# Patient Record
Sex: Male | Born: 1970 | Race: White | Hispanic: No | Marital: Married | Smoking: Former smoker
Health system: Southern US, Community
[De-identification: ages and names within clinical notes are randomized; demographics above are authoritative.]

## PROBLEM LIST (undated history)

## (undated) DIAGNOSIS — E079 Disorder of thyroid, unspecified: Secondary | ICD-10-CM

## (undated) DIAGNOSIS — E785 Hyperlipidemia, unspecified: Secondary | ICD-10-CM

## (undated) DIAGNOSIS — L570 Actinic keratosis: Secondary | ICD-10-CM

## (undated) HISTORY — DX: Actinic keratosis: L57.0

---
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
## 2005-11-07 ENCOUNTER — Ambulatory Visit: Payer: Self-pay | Admitting: Family Medicine

## 2005-11-14 ENCOUNTER — Ambulatory Visit: Payer: Self-pay | Admitting: Family Medicine

## 2006-01-02 ENCOUNTER — Ambulatory Visit: Payer: Self-pay

## 2011-05-05 DIAGNOSIS — E079 Disorder of thyroid, unspecified: Secondary | ICD-10-CM

## 2011-05-05 HISTORY — DX: Disorder of thyroid, unspecified: E07.9

## 2011-05-09 DIAGNOSIS — E785 Hyperlipidemia, unspecified: Secondary | ICD-10-CM

## 2011-05-09 HISTORY — DX: Hyperlipidemia, unspecified: E78.5

## 2016-03-25 ENCOUNTER — Ambulatory Visit
Admission: EM | Admit: 2016-03-25 | Discharge: 2016-03-25 | Disposition: A | Discharge status: Home / Self Care | Payer: BLUE CROSS/BLUE SHIELD | Attending: Family Medicine | Admitting: Family Medicine

## 2016-03-25 ENCOUNTER — Encounter: Payer: Self-pay | Admitting: *Deleted

## 2016-03-25 DIAGNOSIS — J0100 Acute maxillary sinusitis, unspecified: Secondary | ICD-10-CM

## 2016-03-25 DIAGNOSIS — J301 Allergic rhinitis due to pollen: Secondary | ICD-10-CM

## 2016-03-25 DIAGNOSIS — H6593 Unspecified nonsuppurative otitis media, bilateral: Secondary | ICD-10-CM

## 2016-03-25 HISTORY — DX: Hyperlipidemia, unspecified: E78.5

## 2016-03-25 HISTORY — DX: Disorder of thyroid, unspecified: E07.9

## 2016-03-25 MED ORDER — AMOXICILLIN-POT CLAVULANATE 875-125 MG PO TABS: 1.0000 | ORAL_TABLET | Freq: Two times a day (BID) | ORAL | Status: DC

## 2016-03-25 MED ORDER — FLUTICASONE PROPIONATE 50 MCG/ACT NA SUSP: 1.0000 | Freq: Two times a day (BID) | NASAL | Status: DC

## 2016-03-25 MED ORDER — SALINE SPRAY 0.65 % NA SOLN: 2.0000 | NASAL | Status: DC

## 2016-03-25 MED ORDER — PSEUDOEPHEDRINE HCL 30 MG PO TABS: 30.0000 mg | ORAL_TABLET | ORAL | Status: AC | PRN

## 2016-03-25 NOTE — ED Provider Notes (Signed)
CSN: [id number]     Arrival date & time 03/25/16  0815 History   First MD Initiated Contact with Patient 03/25/16 0840     Chief Complaint  Patient presents with  . Sore Throat   (Consider location/radiation/quality/duration/timing/severity/associated sxs/prior Treatment) HPI Comments: Married caucasian male here for evaluation of sore throat, sinus pressure, ear pressure left greater than right.  Has tried zyrtec, tylenol, sudafed, saline and flonase.  Stopped saline and flonase earlier this week.  Denied sinus infection in the past year but has had them during his lifetime.  PMHx seasonal allergies zyrtec typically works  Patient is a 45 y.o. male presenting with pharyngitis. The history is provided by the patient.  Sore Throat This is a new problem. The current episode started more than 1 week ago. The problem occurs constantly. The problem has been gradually worsening. Pertinent negatives include no chest pain, no abdominal pain, no headaches and no shortness of breath. The symptoms are aggravated by swallowing. The symptoms are relieved by NSAIDs. He has tried acetaminophen, rest, food and water for the symptoms. The treatment provided mild relief.    Past Medical History  Diagnosis Date  . Thyroid disease   . Hyperlipemia    Past Surgical History  Procedure Laterality Date  . Appendectomy     History reviewed. No pertinent family history. Social History  Substance Use Topics  . Smoking status: Former Research scientist (life sciences)  . Smokeless tobacco: None  . Alcohol Use: No    Review of Systems  Constitutional: Negative for fever, chills, diaphoresis, activity change, appetite change, fatigue and unexpected weight change.  HENT: Positive for congestion, ear pain, postnasal drip, sinus pressure and sore throat. Negative for dental problem, drooling, ear discharge, facial swelling, hearing loss, mouth sores, nosebleeds, rhinorrhea, sneezing, tinnitus, trouble swallowing and voice change.   Eyes:  Negative for photophobia, pain, discharge, redness, itching and visual disturbance.  Respiratory: Positive for cough. Negative for choking, chest tightness, shortness of breath, wheezing and stridor.   Cardiovascular: Negative for chest pain, palpitations and leg swelling.  Gastrointestinal: Negative for nausea, vomiting, abdominal pain, diarrhea, constipation, blood in stool and abdominal distention.  Endocrine: Negative for cold intolerance and heat intolerance.  Genitourinary: Negative for dysuria.  Musculoskeletal: Negative for myalgias, back pain, joint swelling, arthralgias, gait problem, neck pain and neck stiffness.  Skin: Negative for color change, pallor, rash and wound.  Allergic/Immunologic: Positive for environmental allergies. Negative for food allergies and immunocompromised state.  Neurological: Negative for dizziness, tremors, seizures, syncope, facial asymmetry, speech difficulty, weakness, light-headedness, numbness and headaches.  Hematological: Negative for adenopathy. Does not bruise/bleed easily.  Psychiatric/Behavioral: Negative for behavioral problems, confusion, sleep disturbance and agitation.    Allergies  Review of patient's allergies indicates no known allergies.  Home Medications   Prior to Admission medications   Medication Sig Start Date End Date Taking? Authorizing Provider  cetirizine (ZYRTEC) 10 MG tablet Take 10 mg by mouth daily.   Yes Historical Provider, MD  levothyroxine (SYNTHROID, LEVOTHROID) 150 MCG tablet Take 150 mcg by mouth daily before breakfast.   Yes Historical Provider, MD  Rosuvastatin Calcium (CRESTOR PO) Take by mouth.   Yes Historical Provider, MD  amoxicillin-clavulanate (AUGMENTIN) 875-125 MG tablet Take 1 tablet by mouth every 12 (twelve) hours. 03/25/16   Olen Cordial, NP  fluticasone (FLONASE) 50 MCG/ACT nasal spray Place 1 spray into both nostrils 2 (two) times daily. 03/25/16   Olen Cordial, NP  pseudoephedrine (SUDAFED)  30 MG tablet Take 1 tablet (  30 mg total) by mouth every 4 (four) hours as needed for congestion (max 240mg  per 24 hours/8 tabs). 03/25/16 04/01/16  Olen Cordial, NP  sodium chloride (OCEAN) 0.65 % SOLN nasal spray Place 2 sprays into both nostrils every 2 (two) hours while awake. 03/25/16 04/29/16  Olen Cordial, NP   Meds Ordered and Administered this Visit  Medications - No data to display  BP 115/70 mmHg  Pulse 63  Temp(Src) 97.7 F (36.5 C) (Oral)  Ht 5\' 10"  (1.778 m)  Wt 193 lb (87.544 kg)  BMI 27.69 kg/m2  SpO2 100% No data found.   Physical Exam  Constitutional: He is oriented to person, place, and time. Vital signs are normal. He appears well-developed and well-nourished. He is active and cooperative.  Non-toxic appearance. He does not have a sickly appearance. He appears ill. No distress.  HENT:  Head: Normocephalic and atraumatic.  Right Ear: Hearing, external ear and ear canal normal. A middle ear effusion is present.  Left Ear: Hearing, external ear and ear canal normal. A middle ear effusion is present.  Nose: Mucosal edema and rhinorrhea present. No nose lacerations, sinus tenderness, nasal deformity, septal deviation or nasal septal hematoma. No epistaxis.  No foreign bodies. Right sinus exhibits no maxillary sinus tenderness and no frontal sinus tenderness. Left sinus exhibits maxillary sinus tenderness. Left sinus exhibits no frontal sinus tenderness.  Mouth/Throat: Uvula is midline and mucous membranes are normal. Mucous membranes are not pale, not dry and not cyanotic. He does not have dentures. No oral lesions. No trismus in the jaw. Normal dentition. No dental abscesses, uvula swelling, lacerations or dental caries. Posterior oropharyngeal edema and posterior oropharyngeal erythema present. No oropharyngeal exudate or tonsillar abscesses.  Cobblestoning posterior pharynx; bilateral TMs with air fluid level; bilateral nasal turbinates edema/erythema cloudy discharge  and congestion; bilateral allergic shiners  Eyes: Conjunctivae, EOM and lids are normal. Pupils are equal, round, and reactive to light. Right eye exhibits no chemosis, no discharge, no exudate and no hordeolum. No foreign body present in the right eye. Left eye exhibits no chemosis, no discharge, no exudate and no hordeolum. No foreign body present in the left eye. Right conjunctiva is not injected. Right conjunctiva has no hemorrhage. Left conjunctiva is not injected. Left conjunctiva has no hemorrhage. No scleral icterus. Right eye exhibits normal extraocular motion and no nystagmus. Left eye exhibits normal extraocular motion and no nystagmus. Right pupil is round and reactive. Left pupil is round and reactive. Pupils are equal.  Neck: Trachea normal and normal range of motion. Neck supple. No tracheal tenderness, no spinous process tenderness and no muscular tenderness present. No rigidity. No tracheal deviation, no edema, no erythema and normal range of motion present. No thyroid mass and no thyromegaly present.  Cardiovascular: Normal rate, regular rhythm, S1 normal, S2 normal, normal heart sounds and intact distal pulses.  PMI is not displaced.  Exam reveals no gallop and no friction rub.   No murmur heard. Pulmonary/Chest: Effort normal and breath sounds normal. No stridor. No respiratory distress. He has no decreased breath sounds. He has no wheezes. He has no rhonchi. He has no rales.  Abdominal: Soft. He exhibits no distension.  Musculoskeletal: Normal range of motion. He exhibits no edema or tenderness.  Lymphadenopathy:       Head (right side): No submental, no submandibular, no tonsillar, no preauricular, no posterior auricular and no occipital adenopathy present.       Head (left side): No submental, no submandibular,  no tonsillar, no preauricular, no posterior auricular and no occipital adenopathy present.    He has no cervical adenopathy.       Right cervical: No superficial cervical,  no deep cervical and no posterior cervical adenopathy present.      Left cervical: No superficial cervical, no deep cervical and no posterior cervical adenopathy present.  Neurological: He is alert and oriented to person, place, and time. He displays no atrophy and no tremor. No cranial nerve deficit or sensory deficit. He exhibits normal muscle tone. He displays no seizure activity. Coordination and gait normal. GCS eye subscore is 4. GCS verbal subscore is 5. GCS motor subscore is 6.  Skin: Skin is warm, dry and intact. No abrasion, no bruising, no burn, no ecchymosis, no laceration, no lesion, no petechiae and no rash noted. He is not diaphoretic. No cyanosis or erythema. No pallor. Nails show no clubbing.  Psychiatric: He has a normal mood and affect. His speech is normal and behavior is normal. Judgment and thought content normal. Cognition and memory are normal.  Nursing note and vitals reviewed.   ED Course  Procedures (including critical care time)  Labs Review Labs Reviewed - No data to display  Imaging Review No results found.    MDM   1. Acute maxillary sinusitis, recurrence not specified   2. Otitis media with effusion, bilateral   3. Seasonal allergic rhinitis due to pollen    Supportive treatment.   No evidence of invasive bacterial infection, non toxic and well hydrated.  This is most likely self limiting viral infection.  I do not see where any further testing or imaging is necessary at this time.   I will suggest supportive care, rest, good hygiene and encourage the patient to take adequate fluids.  The patient is to return to clinic or EMERGENCY ROOM if symptoms worsen or change significantly e.g. ear pain, fever, purulent discharge from ears or bleeding.  Exitcare handout on otitis media with effusion given to patient.  Patient verbalized agreement and understanding of treatment plan.    Continue zyrtec 10mg  po daily.  Be more aggressive with nasal saline use.  Restart  flonase 1 spray each nostril BID.  If still having post nasal drip start sudafed 30mg  1-2 tabs po q6h prn rhinitis.  Max 240mg  per 24 hours/8 tabs.  Discussed may cause drowsiness, increased heart rate and/or insomnia with patient.  If palpitations stop use.Patient may use normal saline nasal spray as needed.  Consider antihistamine or nasal steroid use.  Avoid triggers if possible.  Shower prior to bedtime if exposed to triggers.  If allergic dust/dust mites recommend mattress/pillow covers/encasements; washing linens, vacuuming, sweeping, dusting weekly.  Call or return to clinic as needed if these symptoms worsen or fail to improve as anticipated.   Exitcare handout on allergic rhinitis given to patient.  Patient verbalized understanding of instructions, agreed with plan of care and had no further questions at this time.  P2:  Avoidance and hand washing.  Restart flonase 1 spray each nostril BID, saline 2 sprays each nostril q2h prn congestion.  If no improvement with 48 hours of saline and flonase use start augmentin 875mg  po BID x 10 days.  Rx given.  No evidence of systemic bacterial infection, non toxic and well hydrated.  I do not see where any further testing or imaging is necessary at this time.   I will suggest supportive care, rest, good hygiene and encourage the patient to take adequate fluids.  The patient is to return to clinic or EMERGENCY ROOM if symptoms worsen or change significantly.  Exitcare handout on sinusitis given to patient.  Patient verbalized agreement and understanding of treatment plan and had no further questions at this time.   P2:  Hand washing and cover cough  Tylenol 1000mg  po QID prn pain.  Suspect due to post nasal drip. Usually no specific medical treatment is needed if a virus is causing the sore throat.  The throat most often gets better on its own within 5 to 7 days.  Antibiotic medicine does not cure viral pharyngitis.   For acute pharyngitis caused by bacteria, your  healthcare provider will prescribe an antibiotic.  Marland Kitchen Do not smoke.  Marland Kitchen Avoid secondhand smoke and other air pollutants.  . Use a cool mist humidifier to add moisture to the air.  . Get plenty of rest.  . You may want to rest your throat by talking less and eating a diet that is mostly liquid or soft for a day or two.   Marland Kitchen Nonprescription throat lozenges and mouthwashes should help relieve the soreness.   . Gargling with warm saltwater and drinking warm liquids may help.  (You can make a saltwater solution by adding 1/4 teaspoon of salt to 8 ounces, or 240 mL, of warm water.)  . A nonprescription pain reliever such as aspirin, acetaminophen, or ibuprofen may ease general aches and pains.   FOLLOW UP with clinic provider if no improvements in the next 7-10 days.  Patient verbalized understanding of instructions and agreed with plan of care. P2:  Hand washing and diet.    Olen Cordial, NP 03/25/16 [phone]

## 2016-03-25 NOTE — ED Notes (Signed)
Pt states that he started with facial pain, eye drainage, green mucus, ear pressure, jaw pain, sore throat started last week

## 2016-03-25 NOTE — Discharge Instructions (Signed)
Allergic Rhinitis Allergic rhinitis is when the mucous membranes in the nose respond to allergens. Allergens are particles in the air that cause your body to have an allergic reaction. This causes you to release allergic antibodies. Through a chain of events, these eventually cause you to release histamine into the blood stream. Although meant to protect the body, it is this release of histamine that causes your discomfort, such as frequent sneezing, congestion, and an itchy, runny nose.  CAUSES Seasonal allergic rhinitis (hay fever) is caused by pollen allergens that may come from grasses, trees, and weeds. Year-round allergic rhinitis (perennial allergic rhinitis) is caused by allergens such as house dust mites, pet dander, and mold spores. SYMPTOMS  Nasal stuffiness (congestion).  Itchy, runny nose with sneezing and tearing of the eyes. DIAGNOSIS Your health care provider can help you determine the allergen or allergens that trigger your symptoms. If you and your health care provider are unable to determine the allergen, skin or blood testing may be used. Your health care provider will diagnose your condition after taking your health history and performing a physical exam. Your health care provider may assess you for other related conditions, such as asthma, pink eye, or an ear infection. TREATMENT Allergic rhinitis does not have a cure, but it can be controlled by:  Medicines that block allergy symptoms. These may include allergy shots, nasal sprays, and oral antihistamines.  Avoiding the allergen. Hay fever may often be treated with antihistamines in pill or nasal spray forms. Antihistamines block the effects of histamine. There are over-the-counter medicines that may help with nasal congestion and swelling around the eyes. Check with your health care provider before taking or giving this medicine. If avoiding the allergen or the medicine prescribed do not work, there are many new medicines  your health care provider can prescribe. Stronger medicine may be used if initial measures are ineffective. Desensitizing injections can be used if medicine and avoidance does not work. Desensitization is when a patient is given ongoing shots until the body becomes less sensitive to the allergen. Make sure you follow up with your health care provider if problems continue. HOME CARE INSTRUCTIONS It is not possible to completely avoid allergens, but you can reduce your symptoms by taking steps to limit your exposure to them. It helps to know exactly what you are allergic to so that you can avoid your specific triggers. SEEK MEDICAL CARE IF:  You have a fever.  You develop a cough that does not stop easily (persistent).  You have shortness of breath.  You start wheezing.  Symptoms interfere with normal daily activities.   This information is not intended to replace advice given to you by your health care provider. Make sure you discuss any questions you have with your health care provider.   Document Released: 08/15/2001 Document Revised: 12/11/2014 Document Reviewed: 07/28/2013 Elsevier Interactive Patient Education 2016 [place]. Otitis Media With Effusion Otitis media with effusion is the presence of fluid in the middle ear. This is a common problem in children, which often follows ear infections. It may be present for weeks or longer after the infection. Unlike an acute ear infection, otitis media with effusion refers only to fluid behind the ear drum and not infection. Children with repeated ear and sinus infections and allergy problems are the most likely to get otitis media with effusion. CAUSES  The most frequent cause of the fluid buildup is dysfunction of the eustachian tubes. These are the tubes that drain fluid  in the ears to the back of the nose (nasopharynx). SYMPTOMS   The main symptom of this condition is hearing loss. As a result, you or your child may:  Listen to the TV  at a loud volume.  Not respond to questions.  Ask "what" often when spoken to.  Mistake or confuse one sound or word for another.  There may be a sensation of fullness or pressure but usually not pain. DIAGNOSIS   Your health care provider will diagnose this condition by examining you or your child's ears.  Your health care provider may test the pressure in you or your child's ear with a tympanometer.  A hearing test may be conducted if the problem persists. TREATMENT   Treatment depends on the duration and the effects of the effusion.  Antibiotics, decongestants, nose drops, and cortisone-type drugs (tablets or nasal spray) may not be helpful.  Children with persistent ear effusions may have delayed language or behavioral problems. Children at risk for developmental delays in hearing, learning, and speech may require referral to a specialist earlier than children not at risk.  You or your child's health care provider may suggest a referral to an ear, nose, and throat surgeon for treatment. The following may help restore normal hearing:  Drainage of fluid.  Placement of ear tubes (tympanostomy tubes).  Removal of adenoids (adenoidectomy). HOME CARE INSTRUCTIONS   Avoid secondhand smoke.  Infants who are breastfed are less likely to have this condition.  Avoid feeding infants while they are lying flat.  Avoid known environmental allergens.  Avoid people who are sick. SEEK MEDICAL CARE IF:   Hearing is not better in 3 months.  Hearing is worse.  Ear pain.  Drainage from the ear.  Dizziness. MAKE SURE YOU:   Understand these instructions.  Will watch your condition.  Will get help right away if you are not doing well or get worse.   This information is not intended to replace advice given to you by your health care provider. Make sure you discuss any questions you have with your health care provider.   Document Released: 12/28/2004 Document Revised:  12/11/2014 Document Reviewed: 06/17/2013 Elsevier Interactive Patient Education 2016 [employer] Sinusitis, Adult Sinusitis is redness, soreness, and inflammation of the paranasal sinuses. Paranasal sinuses are air pockets within the bones of your face. They are located beneath your eyes, in the middle of your forehead, and above your eyes. In healthy paranasal sinuses, mucus is able to drain out, and air is able to circulate through them by way of your nose. However, when your paranasal sinuses are inflamed, mucus and air can become trapped. This can allow bacteria and other germs to grow and cause infection. Sinusitis can develop quickly and last only a short time (acute) or continue over a long period (chronic). Sinusitis that lasts for more than 12 weeks is considered chronic. CAUSES Causes of sinusitis include:  Allergies.  Structural abnormalities, such as displacement of the cartilage that separates your nostrils (deviated septum), which can decrease the air flow through your nose and sinuses and affect sinus drainage.  Functional abnormalities, such as when the small hairs (cilia) that line your sinuses and help remove mucus do not work properly or are not present. SIGNS AND SYMPTOMS Symptoms of acute and chronic sinusitis are the same. The primary symptoms are pain and pressure around the affected sinuses. Other symptoms include:  Upper toothache.  Earache.  Headache.  Bad breath.  Decreased sense of smell and taste.  A cough, which worsens when you are lying flat.  Fatigue.  Fever.  Thick drainage from your nose, which often is green and may contain pus (purulent).  Swelling and warmth over the affected sinuses. DIAGNOSIS Your health care provider will perform a physical exam. During your exam, your health care provider may perform any of the following to help determine if you have acute sinusitis or chronic sinusitis:  Look in your nose for signs of abnormal  growths in your nostrils (nasal polyps).  Tap over the affected sinus to check for signs of infection.  View the inside of your sinuses using an imaging device that has a light attached (endoscope). If your health care provider suspects that you have chronic sinusitis, one or more of the following tests may be recommended:  Allergy tests.  Nasal culture. A sample of mucus is taken from your nose, sent to a lab, and screened for bacteria.  Nasal cytology. A sample of mucus is taken from your nose and examined by your health care provider to determine if your sinusitis is related to an allergy. TREATMENT Most cases of acute sinusitis are related to a viral infection and will resolve on their own within 10 days. Sometimes, medicines are prescribed to help relieve symptoms of both acute and chronic sinusitis. These may include pain medicines, decongestants, nasal steroid sprays, or saline sprays. However, for sinusitis related to a bacterial infection, your health care provider will prescribe antibiotic medicines. These are medicines that will help kill the bacteria causing the infection. Rarely, sinusitis is caused by a fungal infection. In these cases, your health care provider will prescribe antifungal medicine. For some cases of chronic sinusitis, surgery is needed. Generally, these are cases in which sinusitis recurs more than 3 times per year, despite other treatments. HOME CARE INSTRUCTIONS  Drink plenty of water. Water helps thin the mucus so your sinuses can drain more easily.  Use a humidifier.  Inhale steam 3-4 times a day (for example, sit in the bathroom with the shower running).  Apply a warm, moist washcloth to your face 3-4 times a day, or as directed by your health care provider.  Use saline nasal sprays to help moisten and clean your sinuses.  Take medicines only as directed by your health care provider.  If you were prescribed either an antibiotic or antifungal medicine,  finish it all even if you start to feel better. SEEK IMMEDIATE MEDICAL CARE IF:  You have increasing pain or severe headaches.  You have nausea, vomiting, or drowsiness.  You have swelling around your face.  You have vision problems.  You have a stiff neck.  You have difficulty breathing.   This information is not intended to replace advice given to you by your health care provider. Make sure you discuss any questions you have with your health care provider.   Document Released: 11/20/2005 Document Revised: 12/11/2014 Document Reviewed: 12/05/2011 Elsevier Interactive Patient Education [employer].

## 2016-12-02 ENCOUNTER — Inpatient Hospital Stay
Admission: EM | Admit: 2016-12-02 | Discharge: 2016-12-07 | DRG: 337 | Disposition: A | Discharge status: Home / Self Care | Payer: BLUE CROSS/BLUE SHIELD | Attending: Surgery | Admitting: Surgery

## 2016-12-02 ENCOUNTER — Inpatient Hospital Stay: Payer: BLUE CROSS/BLUE SHIELD

## 2016-12-02 ENCOUNTER — Emergency Department: Payer: BLUE CROSS/BLUE SHIELD

## 2016-12-02 ENCOUNTER — Encounter: Payer: Self-pay | Admitting: *Deleted

## 2016-12-02 DIAGNOSIS — K5651 Intestinal adhesions [bands], with partial obstruction: Secondary | ICD-10-CM | POA: Diagnosis present

## 2016-12-02 DIAGNOSIS — K219 Gastro-esophageal reflux disease without esophagitis: Secondary | ICD-10-CM | POA: Diagnosis present

## 2016-12-02 DIAGNOSIS — E785 Hyperlipidemia, unspecified: Secondary | ICD-10-CM | POA: Diagnosis present

## 2016-12-02 DIAGNOSIS — R1084 Generalized abdominal pain: Secondary | ICD-10-CM

## 2016-12-02 DIAGNOSIS — Z79899 Other long term (current) drug therapy: Secondary | ICD-10-CM

## 2016-12-02 DIAGNOSIS — K56609 Unspecified intestinal obstruction, unspecified as to partial versus complete obstruction: Secondary | ICD-10-CM | POA: Diagnosis present

## 2016-12-02 DIAGNOSIS — E079 Disorder of thyroid, unspecified: Secondary | ICD-10-CM | POA: Diagnosis present

## 2016-12-02 DIAGNOSIS — Z87891 Personal history of nicotine dependence: Secondary | ICD-10-CM | POA: Diagnosis not present

## 2016-12-02 LAB — URINALYSIS, COMPLETE (UACMP) WITH MICROSCOPIC
BACTERIA UA: NONE SEEN
BILIRUBIN URINE: NEGATIVE
GLUCOSE, UA: NEGATIVE mg/dL
HGB URINE DIPSTICK: NEGATIVE
Ketones, ur: 5 mg/dL — AB
LEUKOCYTES UA: NEGATIVE
NITRITE: NEGATIVE
PROTEIN: 30 mg/dL — AB
Specific Gravity, Urine: >1.046 — ABNORMAL HIGH (ref 1.005–1.030)
pH: 5 (ref 5.0–8.0)

## 2016-12-02 LAB — COMPREHENSIVE METABOLIC PANEL
ALT: 31 U/L (ref 17–63)
ANION GAP: 7 (ref 5–15)
AST: 22 U/L (ref 15–41)
Albumin: 4.5 g/dL (ref 3.5–5.0)
Alkaline Phosphatase: 55 U/L (ref 38–126)
BUN: 21 mg/dL — ABNORMAL HIGH (ref 6–20)
CHLORIDE: 104 mmol/L (ref 101–111)
CO2: 27 mmol/L (ref 22–32)
Calcium: 9.7 mg/dL (ref 8.9–10.3)
Creatinine, Ser: 1.23 mg/dL (ref 0.61–1.24)
Glucose, Bld: 101 mg/dL — ABNORMAL HIGH (ref 65–99)
POTASSIUM: 3.5 mmol/L (ref 3.5–5.1)
Sodium: 138 mmol/L (ref 135–145)
Total Bilirubin: 1.1 mg/dL (ref 0.3–1.2)
Total Protein: 8.1 g/dL (ref 6.5–8.1)

## 2016-12-02 LAB — CBC
HEMATOCRIT: 49.1 % (ref 40.0–52.0)
Hemoglobin: 17.2 g/dL (ref 13.0–18.0)
MCH: 30.8 pg (ref 26.0–34.0)
MCHC: 35 g/dL (ref 32.0–36.0)
MCV: 87.9 fL (ref 80.0–100.0)
Platelets: 312 10*3/uL (ref 150–440)
RBC: 5.59 MIL/uL (ref 4.40–5.90)
RDW: 13.1 % (ref 11.5–14.5)
WBC: 10.1 10*3/uL (ref 3.8–10.6)

## 2016-12-02 LAB — LIPASE, BLOOD: LIPASE: 19 U/L (ref 11–51)

## 2016-12-02 LAB — TROPONIN I

## 2016-12-02 MED ORDER — MORPHINE SULFATE (PF) 4 MG/ML IV SOLN
4.0000 mg | INTRAVENOUS | Status: DC | PRN
  Administered 2016-12-02 (×2): 4 mg via INTRAVENOUS
  Filled 2016-12-02 (×3): qty 1

## 2016-12-02 MED ORDER — LACTATED RINGERS IV BOLUS (SEPSIS)
1000.0000 mL | Freq: Once | INTRAVENOUS | Status: AC
  Administered 2016-12-02: 1000 mL via INTRAVENOUS

## 2016-12-02 MED ORDER — PROMETHAZINE HCL 25 MG/ML IJ SOLN
INTRAMUSCULAR | Status: AC
  Filled 2016-12-02: qty 1

## 2016-12-02 MED ORDER — ONDANSETRON HCL 4 MG/2ML IJ SOLN
4.0000 mg | Freq: Four times a day (QID) | INTRAMUSCULAR | Status: DC | PRN
  Administered 2016-12-02 – 2016-12-07 (×2): 4 mg via INTRAVENOUS
  Filled 2016-12-02 (×2): qty 2

## 2016-12-02 MED ORDER — PROMETHAZINE HCL 25 MG/ML IJ SOLN
12.5000 mg | Freq: Once | INTRAMUSCULAR | Status: AC
  Administered 2016-12-02: 12.5 mg via INTRAVENOUS
  Filled 2016-12-02: qty 1

## 2016-12-02 MED ORDER — ONDANSETRON HCL 4 MG PO TABS: 4.0000 mg | ORAL_TABLET | Freq: Four times a day (QID) | ORAL | Status: DC | PRN

## 2016-12-02 MED ORDER — HEPARIN SODIUM (PORCINE) 5000 UNIT/ML IJ SOLN
5000.0000 [IU] | Freq: Three times a day (TID) | INTRAMUSCULAR | Status: DC
  Administered 2016-12-02 – 2016-12-07 (×13): 5000 [IU] via SUBCUTANEOUS
  Filled 2016-12-02 (×13): qty 1

## 2016-12-02 MED ORDER — PHENOL 1.4 % MT LIQD
1.0000 | OROMUCOSAL | Status: DC | PRN
  Administered 2016-12-03: 1 via OROMUCOSAL
  Filled 2016-12-02: qty 177

## 2016-12-02 MED ORDER — KCL IN DEXTROSE-NACL 20-5-0.45 MEQ/L-%-% IV SOLN
INTRAVENOUS | Status: DC
  Administered 2016-12-02 – 2016-12-07 (×14): via INTRAVENOUS
  Filled 2016-12-02 (×19): qty 1000

## 2016-12-02 MED ORDER — IOPAMIDOL (ISOVUE-300) INJECTION 61%
100.0000 mL | Freq: Once | INTRAVENOUS | Status: AC | PRN
  Administered 2016-12-02: 100 mL via INTRAVENOUS

## 2016-12-02 MED ORDER — FLUTICASONE PROPIONATE 50 MCG/ACT NA SUSP
1.0000 | Freq: Two times a day (BID) | NASAL | Status: DC | PRN
  Filled 2016-12-02: qty 16

## 2016-12-02 MED ORDER — SODIUM CHLORIDE 0.9 % IV BOLUS (SEPSIS)
1000.0000 mL | Freq: Once | INTRAVENOUS | Status: AC
  Administered 2016-12-02: 1000 mL via INTRAVENOUS

## 2016-12-02 NOTE — H&P (Signed)
Scott Gillespie. is an 45 y.o. male.    Chief Complaint: Nausea and vomiting  HPI: This patient with 3 days of nausea vomiting and diarrhea. He's had this happened multiple times over the past few years. He has had bloating and nausea but never emesis to the extent that he has in the last 3 days where he has been vomiting multiple times and cannot keep anything down not even liquids. He's had diarrhea and some abdominal pain that his abdominal pain is gone at this point. He denies fevers or chills.  Past Medical History:  Diagnosis Date  . Hyperlipemia   . Thyroid disease     Past Surgical History:  Procedure Laterality Date  . APPENDECTOMY      No family history on file. Social History:  reports that he has quit smoking. He does not have any smokeless tobacco history on file. He reports that he does not drink alcohol or use drugs.  Allergies: No Known Allergies   (Not in a hospital admission)   Review of Systems  Constitutional: Negative for chills, fever and weight loss.  HENT: Negative.   Eyes: Negative.   Respiratory: Negative.   Cardiovascular: Negative.   Gastrointestinal: Positive for abdominal pain, diarrhea, nausea and vomiting. Negative for blood in stool, constipation, heartburn and melena.  Genitourinary: Negative.   Musculoskeletal: Negative.   Skin: Negative.   Neurological: Negative.   Endo/Heme/Allergies: Negative.   Psychiatric/Behavioral: Negative.      Physical Exam:  BP (!) 135/55 (BP Location: Right Arm)   Pulse 99   Temp 97.8 F (36.6 C) (Oral)   Resp 18   Ht 5' 11"  (1.803 m)   Wt 200 lb (90.7 kg)   SpO2 95%   BMI 27.89 kg/m   Physical Exam  Constitutional: He is oriented to person, place, and time and well-developed, well-nourished, and in no distress. No distress.  Uncomfortable appearing anxious-appearing  HENT:  Head: Normocephalic.  Dry mucous membranes  Eyes: Pupils are equal, round, and reactive to light. Right eye  exhibits no discharge. Left eye exhibits no discharge. No scleral icterus.  Neck: Normal range of motion.  Cardiovascular: Normal rate, regular rhythm and normal heart sounds.   Pulmonary/Chest: Effort normal and breath sounds normal. No respiratory distress. He has no wheezes. He has no rales.  Abdominal: Soft. He exhibits distension. There is no tenderness. There is no rebound and no guarding.  Intended complex right lower quadrant scar abdomen is distended and tympanitic but nontender  Musculoskeletal: Normal range of motion. He exhibits no edema or tenderness.  Lymphadenopathy:    He has no cervical adenopathy.  Neurological: He is alert and oriented to person, place, and time.  Skin: Skin is warm and dry. No rash noted. He is not diaphoretic. No erythema.  Psychiatric: Mood and affect normal.  Vitals reviewed.       Results for orders placed or performed during the hospital encounter of 12/02/16 (from the past 48 hour(s))  Lipase, blood     Status: None   Collection Time: 12/02/16  9:01 AM  Result Value Ref Range   Lipase 19 11 - 51 U/L  Comprehensive metabolic panel     Status: Abnormal   Collection Time: 12/02/16  9:01 AM  Result Value Ref Range   Sodium 138 135 - 145 mmol/L   Potassium 3.5 3.5 - 5.1 mmol/L   Chloride 104 101 - 111 mmol/L   CO2 27 22 - 32 mmol/L   Glucose,  Bld 101 (H) 65 - 99 mg/dL   BUN 21 (H) 6 - 20 mg/dL   Creatinine, Ser 1.23 0.61 - 1.24 mg/dL   Calcium 9.7 8.9 - 10.3 mg/dL   Total Protein 8.1 6.5 - 8.1 g/dL   Albumin 4.5 3.5 - 5.0 g/dL   AST 22 15 - 41 U/L   ALT 31 17 - 63 U/L   Alkaline Phosphatase 55 38 - 126 U/L   Total Bilirubin 1.1 0.3 - 1.2 mg/dL   GFR calc non Af Amer >60 >60 mL/min   GFR calc Af Amer >60 >60 mL/min    Comment: (NOTE) The eGFR has been calculated using the CKD EPI equation. This calculation has not been validated in all clinical situations. eGFR's persistently <60 mL/min signify possible Chronic Kidney Disease.     Anion gap 7 5 - 15  CBC     Status: None   Collection Time: 12/02/16  9:01 AM  Result Value Ref Range   WBC 10.1 3.8 - 10.6 K/uL   RBC 5.59 4.40 - 5.90 MIL/uL   Hemoglobin 17.2 13.0 - 18.0 g/dL   HCT 49.1 40.0 - 52.0 %   MCV 87.9 80.0 - 100.0 fL   MCH 30.8 26.0 - 34.0 pg   MCHC 35.0 32.0 - 36.0 g/dL   RDW 13.1 11.5 - 14.5 %   Platelets 312 150 - 440 K/uL  Urinalysis, Complete w Microscopic     Status: Abnormal   Collection Time: 12/02/16  9:01 AM  Result Value Ref Range   Color, Urine YELLOW (A) YELLOW   APPearance CLEAR (A) CLEAR   Specific Gravity, Urine >1.046 (H) 1.005 - 1.030   pH 5.0 5.0 - 8.0   Glucose, UA NEGATIVE NEGATIVE mg/dL   Hgb urine dipstick NEGATIVE NEGATIVE   Bilirubin Urine NEGATIVE NEGATIVE   Ketones, ur 5 (A) NEGATIVE mg/dL   Protein, ur 30 (A) NEGATIVE mg/dL   Nitrite NEGATIVE NEGATIVE   Leukocytes, UA NEGATIVE NEGATIVE   RBC / HPF 6-30 0 - 5 RBC/hpf   WBC, UA 0-5 0 - 5 WBC/hpf   Bacteria, UA NONE SEEN NONE SEEN   Squamous Epithelial / LPF 0-5 (A) NONE SEEN   Mucous PRESENT    Hyaline Casts, UA PRESENT   Troponin I     Status: None   Collection Time: 12/02/16  9:01 AM  Result Value Ref Range   Troponin I <0.03 <0.03 ng/mL   Ct Abdomen Pelvis W Contrast  Result Date: 12/02/2016 CLINICAL DATA:  45 year old male with acute left lower quadrant abdominal pain, bloating and fever. Initial encounter. EXAM: CT ABDOMEN AND PELVIS WITH CONTRAST TECHNIQUE: Multidetector CT imaging of the abdomen and pelvis was performed using the standard protocol following bolus administration of intravenous contrast. CONTRAST:  112m ISOVUE-300 IOPAMIDOL (ISOVUE-300) INJECTION 61% COMPARISON:  None. FINDINGS: Lower chest: Minor lung base atelectasis. No pericardial or pleural effusion. No upper abdominal free air. Hepatobiliary: Small volume of free fluid along the inferior right hepatic lobe. Otherwise negative liver and gallbladder. Pancreas: Negative. Spleen: Negative.  Adrenals/Urinary Tract: Normal adrenal glands. Normal bilateral renal enhancement. There is little renal contrast excretion on delayed images. The IVC also is diminutive. Unremarkable urinary bladder. Stomach/Bowel: There is low-density fluid in the rectum. Sigmoid colon is redundant and contains fluid. No definite sigmoid diverticula. Both the rectal and sigmoid mucosa appears to be hyperenhancing. There is a smaller caliber to the mid sigmoid colon on series 2, image 84 but there is no discrete  mass or obstructing lesion. Upstream of the sigmoid colon the left colon is intermittently distended with gas and fluid. There is gas distending the transverse colon. The hepatic flexure is redundant. There is fluid distending the right colon. There is a small volume of free fluid in the upper right gutter and along the inferior right liver margin (series 2, image 38). Sequelae of appendectomy. The terminal ileum is decompressed. However, small bowel loops in the proximal third of the jejunum become dilated with fluid and air measuring up to 4 cm diameter. These loops remain dilated into the right abdomen where there is a short segment of decompressed small bowel (on series 2, image 58), followed by a fluid-filled dilated loop up to 35 mm diameter which then tapers quickly just proximal to the terminal ileum in the vicinity of the appendectomy clip (series 2, image 77 and series 5, image 49). The proximal jejunum is decompressed. The stomach is decompressed and negative. Vascular/Lymphatic: Major arterial structures in the abdomen and pelvis are normal. Portal venous system is patent. Reproductive: Negative. Other: Small volume of free fluid in the mid and upper pelvis (series 2, image 85 and coronal image 65)). Musculoskeletal: Negative. IMPRESSION: 1. High high-grade small bowel obstruction with transition point in proximity to the appendectomy clip adjacent to the terminal ileum. Favor postoperative adhesion. 2. There is  then superimposed gas and fluid distending most of the large bowel, such as due to a large bowel ileus with diarrhea. There is some hyper-enhancement of the sigmoid and rectal mucosa, but no overt colitis. No diverticula or diverticulitis. 3. Small volume of free fluid in the right abdomen and pelvis felt to be reactive to #1 and/or #2. No free air. Electronically Signed   By: Genevie Ann M.D.   On: 12/02/2016 12:40     Assessment/Plan  This patient with nausea vomiting and diarrhea he is showing signs of partial small bowel obstruction although is read as a high-grade he's having considerable diarrhea and there is gas in his colon. Needs to be rehydrated because of his extensive nausea vomiting and diarrhea and he needs a nasogastric tube. His CT scan is been personally reviewed. I discussed with he and his family member the rationale for admission to the hospital and observation and should this not improve promptly then exploratory laparotomy would be necessary. Questions were answered for him he understood and agreed to proceed  Florene Glen, MD, FACS

## 2016-12-02 NOTE — Progress Notes (Signed)
Visited patient. Patient about the same had moderate output from the NG tube abdomen remains distended and tympanitic but nontender. Will reexamine in morning.

## 2016-12-02 NOTE — ED Provider Notes (Signed)
[facility] Emergency Department Provider Note    None    (approximate)  I have reviewed the triage vital signs and the nursing notes.   HISTORY  Chief Complaint Abdominal Pain    HPI Scott Gillespie. is a 45 y.o. male several days of progressively worsening left lower quadrant pain as well as nausea and vomiting with diarrhea this morning. No blood in his stools. Has had low-grade fevers but no measured temperatures. States he's had crampy abdominal pain. States the pain is severe and unrelenting. There is no radiation. Denies any burning when he eased. No hematuria. Is artery had his appendix removed.   Past Medical History:  Diagnosis Date  . Hyperlipemia   . Thyroid disease    No family history on file. Past Surgical History:  Procedure Laterality Date  . APPENDECTOMY     Patient Active Problem List   Diagnosis Date Noted  . SBO (small bowel obstruction) 12/02/2016      Prior to Admission medications   Medication Sig Start Date End Date Taking? Authorizing Provider  cetirizine (ZYRTEC) 10 MG tablet Take 10 mg by mouth daily.   Yes Historical Provider, MD  esomeprazole (NEXIUM) 40 MG capsule Take 40 mg by mouth daily at 12 noon.   Yes Historical Provider, MD  fluticasone (FLONASE) 50 MCG/ACT nasal spray Place 1 spray into both nostrils 2 (two) times daily. 03/25/16  Yes Olen Cordial, NP  levothyroxine (SYNTHROID, LEVOTHROID) 150 MCG tablet Take 150 mcg by mouth daily before breakfast.   Yes Historical Provider, MD  Rosuvastatin Calcium (CRESTOR PO) Take 10 mg by mouth daily.    Yes Historical Provider, MD  sodium chloride (OCEAN) 0.65 % SOLN nasal spray Place 2 sprays into both nostrils every 2 (two) hours while awake. 03/25/16 12/02/17 Yes Olen Cordial, NP    Allergies Patient has no known allergies.    Social History Social History  Substance Use Topics  . Smoking status: Former Research scientist (life sciences)  . Smokeless tobacco: Not on file   . Alcohol use No    Review of Systems Patient denies headaches, rhinorrhea, blurry vision, numbness, shortness of breath, chest pain, edema, cough, abdominal pain, nausea, vomiting, diarrhea, dysuria, fevers, rashes or hallucinations unless otherwise stated above in HPI. ____________________________________________   PHYSICAL EXAM:  VITAL SIGNS: Vitals:   12/02/16 1230 12/02/16 1300  BP: 116/84 116/81  Pulse: 86 80  Resp:    Temp:     Connstitutional: Alert and oriented. Well appearing and in no acute distress. Eyes: Conjunctivae are normal. PERRL. EOMI. Head: Atraumatic. Nose: No congestion/rhinnorhea. Mouth/Throat: Mucous membranes are moist.  Oropharynx non-erythematous. Neck: No stridor. Painless ROM. No cervical spine tenderness to palpation Hematological/Lymphatic/Immunilogical: No cervical lymphadenopathy. Cardiovascular: Normal rate, regular rhythm. Grossly normal heart sounds.  Good peripheral circulation. Respiratory: Normal respiratory effort.  No retractions. Lungs CTAB. Gastrointestinal: Soft with llq ttp. No distention. No abdominal bruits. No CVA tenderness. Genitourinary:  Musculoskeletal: No lower extremity tenderness nor edema.  No joint effusions. Neurologic:  Normal speech and language. No gross focal neurologic deficits are appreciated. No gait instability. Skin:  Skin is warm, dry and intact. No rash noted. Psychiatric: Mood and affect are normal. Speech and behavior are normal.  ____________________________________________   LABS (all labs ordered are listed, but only abnormal results are displayed)  Results for orders placed or performed during the hospital encounter of 12/02/16 (from the past 24 hour(s))  Lipase, blood     Status: None  Collection Time: 12/02/16  9:01 AM  Result Value Ref Range   Lipase 19 11 - 51 U/L  Comprehensive metabolic panel     Status: Abnormal   Collection Time: 12/02/16  9:01 AM  Result Value Ref Range   Sodium 138  135 - 145 mmol/L   Potassium 3.5 3.5 - 5.1 mmol/L   Chloride 104 101 - 111 mmol/L   CO2 27 22 - 32 mmol/L   Glucose, Bld 101 (H) 65 - 99 mg/dL   BUN 21 (H) 6 - 20 mg/dL   Creatinine, Ser 1.23 0.61 - 1.24 mg/dL   Calcium 9.7 8.9 - 10.3 mg/dL   Total Protein 8.1 6.5 - 8.1 g/dL   Albumin 4.5 3.5 - 5.0 g/dL   AST 22 15 - 41 U/L   ALT 31 17 - 63 U/L   Alkaline Phosphatase 55 38 - 126 U/L   Total Bilirubin 1.1 0.3 - 1.2 mg/dL   GFR calc non Af Amer >60 >60 mL/min   GFR calc Af Amer >60 >60 mL/min   Anion gap 7 5 - 15  CBC     Status: None   Collection Time: 12/02/16  9:01 AM  Result Value Ref Range   WBC 10.1 3.8 - 10.6 K/uL   RBC 5.59 4.40 - 5.90 MIL/uL   Hemoglobin 17.2 13.0 - 18.0 g/dL   HCT 49.1 40.0 - 52.0 %   MCV 87.9 80.0 - 100.0 fL   MCH 30.8 26.0 - 34.0 pg   MCHC 35.0 32.0 - 36.0 g/dL   RDW 13.1 11.5 - 14.5 %   Platelets 312 150 - 440 K/uL  Urinalysis, Complete w Microscopic     Status: Abnormal   Collection Time: 12/02/16  9:01 AM  Result Value Ref Range   Color, Urine YELLOW (A) YELLOW   APPearance CLEAR (A) CLEAR   Specific Gravity, Urine >1.046 (H) 1.005 - 1.030   pH 5.0 5.0 - 8.0   Glucose, UA NEGATIVE NEGATIVE mg/dL   Hgb urine dipstick NEGATIVE NEGATIVE   Bilirubin Urine NEGATIVE NEGATIVE   Ketones, ur 5 (A) NEGATIVE mg/dL   Protein, ur 30 (A) NEGATIVE mg/dL   Nitrite NEGATIVE NEGATIVE   Leukocytes, UA NEGATIVE NEGATIVE   RBC / HPF 6-30 0 - 5 RBC/hpf   WBC, UA 0-5 0 - 5 WBC/hpf   Bacteria, UA NONE SEEN NONE SEEN   Squamous Epithelial / LPF 0-5 (A) NONE SEEN   Mucous PRESENT    Hyaline Casts, UA PRESENT   Troponin I     Status: None   Collection Time: 12/02/16  9:01 AM  Result Value Ref Range   Troponin I <0.03 <0.03 ng/mL   ____________________________________________  EKG____________________________________________  RADIOLOGY  I personally reviewed all radiographic images ordered to evaluate for the above acute complaints and reviewed radiology  reports and findings.  These findings were personally discussed with the patient.  Please see medical record for radiology report.  ____________________________________________   PROCEDURES  Procedure(s) performed:  Procedures    Critical Care performed: no ____________________________________________   INITIAL IMPRESSION / ASSESSMENT AND PLAN / ED COURSE  Pertinent labs & imaging results that were available during my care of the patient were reviewed by me and considered in my medical decision making (see chart for details).  DDX: diverticulitis, abscess, colitis, age, virasl illness, obstruction  Scott Barraza. is a 45 y.o. who presents to the ED with acute left lower quadrant pain. Patient arrives afebrile but tachycardic.  Does have a mild acidosis on blood work. No leukocytosis. Based on his pain and will order CT imaging to evaluate for diverticulitis versus diffuse colitis. We'll provide IV fluids for dehydration.  Clinical Course as of Dec 02 1336  Sat Dec 02, 2016  1257 CT imaging shows evidence of high-grade small bowel obstruction. Patient's pain has improved but still there. A spoke with Dr. Burt Knack of general surgery who agrees to evaluate the patient for further evaluation and management.  Have discussed with the patient and available family all diagnostics and treatments performed thus far and all questions were answered to the best of my ability. The patient demonstrates understanding and agreement with plan.   [PR]    Clinical Course User Index [PR] Merlyn Lot, MD     ____________________________________________   FINAL CLINICAL IMPRESSION(S) / ED DIAGNOSES  Final diagnoses:  Generalized abdominal pain  Small bowel obstruction      NEW MEDICATIONS STARTED DURING THIS VISIT:  New Prescriptions   No medications on file     Note:  This document was prepared using Dragon voice recognition software and may include unintentional dictation  errors.    Merlyn Lot, MD 12/02/16 [phone]

## 2016-12-02 NOTE — ED Notes (Signed)
Pt disconnected from suction with 200cc out

## 2016-12-02 NOTE — ED Notes (Signed)
Report called to the floor - awaiting xr for ng tube placement

## 2016-12-02 NOTE — ED Notes (Signed)
Pt states had a stomach virus last Thursday but seems to continue where as the rest of the household who got sick got better. Has hx of acid reflux. Last vomited this am and last loose stool this am. Did not take his acid reflux med this am. Pain is intermittent cramping.

## 2016-12-02 NOTE — ED Notes (Signed)
Ng advanced to 68 at dr request after xr to confirm placement

## 2016-12-03 ENCOUNTER — Encounter
Admission: EM | Disposition: A | Discharge status: Home / Self Care | Payer: Self-pay | Source: Home / Self Care | Attending: Surgery

## 2016-12-03 ENCOUNTER — Inpatient Hospital Stay: Payer: BLUE CROSS/BLUE SHIELD | Admitting: Anesthesiology

## 2016-12-03 ENCOUNTER — Encounter: Payer: Self-pay | Admitting: *Deleted

## 2016-12-03 ENCOUNTER — Inpatient Hospital Stay: Payer: BLUE CROSS/BLUE SHIELD

## 2016-12-03 HISTORY — PX: LAPAROTOMY: SHX154

## 2016-12-03 LAB — CBC
HCT: 39.2 % — ABNORMAL LOW (ref 40.0–52.0)
Hemoglobin: 13.9 g/dL (ref 13.0–18.0)
MCH: 31.2 pg (ref 26.0–34.0)
MCHC: 35.5 g/dL (ref 32.0–36.0)
MCV: 88 fL (ref 80.0–100.0)
PLATELETS: 222 10*3/uL (ref 150–440)
RBC: 4.45 MIL/uL (ref 4.40–5.90)
RDW: 13 % (ref 11.5–14.5)
WBC: 7.5 10*3/uL (ref 3.8–10.6)

## 2016-12-03 LAB — BASIC METABOLIC PANEL
Anion gap: 3 — ABNORMAL LOW (ref 5–15)
BUN: 9 mg/dL (ref 6–20)
CALCIUM: 8.5 mg/dL — AB (ref 8.9–10.3)
CO2: 28 mmol/L (ref 22–32)
Chloride: 109 mmol/L (ref 101–111)
Creatinine, Ser: 0.78 mg/dL (ref 0.61–1.24)
GFR calc Af Amer: >60 mL/min (ref >60)
Glucose, Bld: 108 mg/dL — ABNORMAL HIGH (ref 65–99)
POTASSIUM: 3.7 mmol/L (ref 3.5–5.1)
SODIUM: 140 mmol/L (ref 135–145)

## 2016-12-03 SURGERY — LAPAROTOMY, EXPLORATORY
Anesthesia: General

## 2016-12-03 MED ORDER — BUPIVACAINE-EPINEPHRINE 0.25% -1:200000 IJ SOLN
INTRAMUSCULAR | Status: DC | PRN
  Administered 2016-12-03: 30 mL

## 2016-12-03 MED ORDER — SODIUM CHLORIDE 0.9% FLUSH: 9.0000 mL | INTRAVENOUS | Status: DC | PRN

## 2016-12-03 MED ORDER — ONDANSETRON HCL 4 MG/2ML IJ SOLN
INTRAMUSCULAR | Status: AC
  Filled 2016-12-03: qty 2

## 2016-12-03 MED ORDER — EPHEDRINE 5 MG/ML INJ
INTRAVENOUS | Status: AC
  Filled 2016-12-03: qty 10

## 2016-12-03 MED ORDER — MIDAZOLAM HCL 2 MG/2ML IJ SOLN
INTRAMUSCULAR | Status: DC | PRN
  Administered 2016-12-03: 2 mg via INTRAVENOUS

## 2016-12-03 MED ORDER — EPHEDRINE SULFATE 50 MG/ML IJ SOLN
INTRAMUSCULAR | Status: DC | PRN
  Administered 2016-12-03: 10 mg via INTRAVENOUS

## 2016-12-03 MED ORDER — DIPHENHYDRAMINE HCL 50 MG/ML IJ SOLN: 12.5000 mg | Freq: Four times a day (QID) | INTRAMUSCULAR | Status: DC | PRN

## 2016-12-03 MED ORDER — BUPIVACAINE-EPINEPHRINE (PF) 0.25% -1:200000 IJ SOLN
INTRAMUSCULAR | Status: AC
  Filled 2016-12-03: qty 30

## 2016-12-03 MED ORDER — SUGAMMADEX SODIUM 200 MG/2ML IV SOLN
INTRAVENOUS | Status: AC
  Filled 2016-12-03: qty 2

## 2016-12-03 MED ORDER — ROCURONIUM BROMIDE 50 MG/5ML IV SOSY
PREFILLED_SYRINGE | INTRAVENOUS | Status: AC
  Filled 2016-12-03: qty 5

## 2016-12-03 MED ORDER — CEFAZOLIN SODIUM-DEXTROSE 2-4 GM/100ML-% IV SOLN
2.0000 g | Freq: Once | INTRAVENOUS | Status: AC
  Administered 2016-12-03: 2 g via INTRAVENOUS
  Filled 2016-12-03: qty 100

## 2016-12-03 MED ORDER — DEXAMETHASONE SODIUM PHOSPHATE 10 MG/ML IJ SOLN
INTRAMUSCULAR | Status: AC
  Filled 2016-12-03: qty 1

## 2016-12-03 MED ORDER — CEFAZOLIN SODIUM 1 G IJ SOLR
INTRAMUSCULAR | Status: AC
  Filled 2016-12-03: qty 10

## 2016-12-03 MED ORDER — HYDROMORPHONE 1 MG/ML IV SOLN
INTRAVENOUS | Status: DC
  Administered 2016-12-03: 0.3 mg via INTRAVENOUS
  Administered 2016-12-03: 0.9 mg via INTRAVENOUS
  Administered 2016-12-04 (×3): 0.6 mg via INTRAVENOUS
  Administered 2016-12-04: 0.9 mg via INTRAVENOUS
  Administered 2016-12-04: 0.6 mg via INTRAVENOUS
  Administered 2016-12-04: 0.3 mg via INTRAVENOUS
  Administered 2016-12-05: 0 mg via INTRAVENOUS
  Administered 2016-12-05: 0.3 mg via INTRAVENOUS
  Administered 2016-12-05: 0.6 mg via INTRAVENOUS
  Administered 2016-12-05 (×2): 0.3 mg via INTRAVENOUS
  Filled 2016-12-03: qty 25

## 2016-12-03 MED ORDER — FENTANYL CITRATE (PF) 100 MCG/2ML IJ SOLN
INTRAMUSCULAR | Status: AC
  Filled 2016-12-03: qty 2

## 2016-12-03 MED ORDER — ONDANSETRON HCL 4 MG/2ML IJ SOLN: 4.0000 mg | Freq: Four times a day (QID) | INTRAMUSCULAR | Status: DC | PRN

## 2016-12-03 MED ORDER — DEXAMETHASONE SODIUM PHOSPHATE 10 MG/ML IJ SOLN
INTRAMUSCULAR | Status: DC | PRN
  Administered 2016-12-03: 10 mg via INTRAVENOUS

## 2016-12-03 MED ORDER — PROPOFOL 10 MG/ML IV BOLUS
INTRAVENOUS | Status: DC | PRN
  Administered 2016-12-03: 130 mg via INTRAVENOUS

## 2016-12-03 MED ORDER — FENTANYL CITRATE (PF) 250 MCG/5ML IJ SOLN
INTRAMUSCULAR | Status: AC
  Filled 2016-12-03: qty 5

## 2016-12-03 MED ORDER — PROPOFOL 10 MG/ML IV BOLUS
INTRAVENOUS | Status: AC
  Filled 2016-12-03: qty 20

## 2016-12-03 MED ORDER — ROCURONIUM BROMIDE 100 MG/10ML IV SOLN
INTRAVENOUS | Status: DC | PRN
  Administered 2016-12-03: 30 mg via INTRAVENOUS
  Administered 2016-12-03: 20 mg via INTRAVENOUS

## 2016-12-03 MED ORDER — CEFAZOLIN SODIUM 1 G IJ SOLR
INTRAMUSCULAR | Status: DC | PRN
  Administered 2016-12-03: 1 g via INTRAMUSCULAR

## 2016-12-03 MED ORDER — FENTANYL CITRATE (PF) 100 MCG/2ML IJ SOLN
25.0000 ug | INTRAMUSCULAR | Status: AC | PRN
  Administered 2016-12-03 (×6): 25 ug via INTRAVENOUS

## 2016-12-03 MED ORDER — MIDAZOLAM HCL 2 MG/2ML IJ SOLN
INTRAMUSCULAR | Status: AC
  Filled 2016-12-03: qty 2

## 2016-12-03 MED ORDER — FENTANYL CITRATE (PF) 100 MCG/2ML IJ SOLN
INTRAMUSCULAR | Status: DC | PRN
Start: 2016-12-03 — End: 2016-12-03
  Administered 2016-12-03: 50 ug via INTRAVENOUS
  Administered 2016-12-03: 150 ug via INTRAVENOUS

## 2016-12-03 MED ORDER — NALOXONE HCL 0.4 MG/ML IJ SOLN: 0.4000 mg | INTRAMUSCULAR | Status: DC | PRN

## 2016-12-03 MED ORDER — LACTATED RINGERS IV SOLN
INTRAVENOUS | Status: DC | PRN
  Administered 2016-12-03: 13:00:00 via INTRAVENOUS

## 2016-12-03 MED ORDER — SUGAMMADEX SODIUM 200 MG/2ML IV SOLN
INTRAVENOUS | Status: DC | PRN
  Administered 2016-12-03: 180 mg via INTRAVENOUS

## 2016-12-03 MED ORDER — DIPHENHYDRAMINE HCL 12.5 MG/5ML PO ELIX: 12.5000 mg | ORAL_SOLUTION | Freq: Four times a day (QID) | ORAL | Status: DC | PRN

## 2016-12-03 MED ORDER — ONDANSETRON HCL 4 MG/2ML IJ SOLN: 4.0000 mg | Freq: Once | INTRAMUSCULAR | Status: DC | PRN

## 2016-12-03 MED ORDER — BUPIVACAINE HCL (PF) 0.25 % IJ SOLN
INTRAMUSCULAR | Status: AC
  Filled 2016-12-03: qty 30

## 2016-12-03 SURGICAL SUPPLY — 29 items
CANISTER SUCT 1200ML W/VALVE (MISCELLANEOUS) IMPLANT
CANISTER SUCT 3000ML (MISCELLANEOUS) ×3 IMPLANT
CATH TRAY 16F METER LATEX (MISCELLANEOUS) ×3 IMPLANT
CHLORAPREP W/TINT 26ML (MISCELLANEOUS) ×3 IMPLANT
DRAPE LAPAROTOMY 100X77 ABD (DRAPES) ×3 IMPLANT
DRSG OPSITE POSTOP 4X10 (GAUZE/BANDAGES/DRESSINGS) ×3 IMPLANT
DRSG TELFA 3X8 NADH (GAUZE/BANDAGES/DRESSINGS) IMPLANT
ELECT REM PT RETURN 9FT ADLT (ELECTROSURGICAL) ×3
ELECTRODE REM PT RTRN 9FT ADLT (ELECTROSURGICAL) ×1 IMPLANT
GAUZE SPONGE 4X4 12PLY STRL (GAUZE/BANDAGES/DRESSINGS) IMPLANT
GLOVE BIO SURGEON STRL SZ8 (GLOVE) ×9 IMPLANT
GOWN STRL REUS W/ TWL LRG LVL3 (GOWN DISPOSABLE) ×2 IMPLANT
GOWN STRL REUS W/TWL LRG LVL3 (GOWN DISPOSABLE) ×4
KIT RM TURNOVER STRD PROC AR (KITS) ×3 IMPLANT
LABEL OR SOLS (LABEL) ×3 IMPLANT
NDL SAFETY 22GX1.5 (NEEDLE) ×3 IMPLANT
NS IRRIG 1000ML POUR BTL (IV SOLUTION) ×3 IMPLANT
PACK BASIN MAJOR ARMC (MISCELLANEOUS) ×3 IMPLANT
PACK COLON CLEAN CLOSURE (MISCELLANEOUS) ×3 IMPLANT
SEPRAFILM MEMBRANE 5X6 (MISCELLANEOUS) ×3 IMPLANT
STAPLER SKIN PROX 35W (STAPLE) ×3 IMPLANT
SUT PDS AB 1 TP1 54 (SUTURE) ×6 IMPLANT
SUT PROLENE 0 CT 1 30 (SUTURE) ×9 IMPLANT
SUT SILK 3-0 (SUTURE) ×3 IMPLANT
SUT VIC AB 3-0 SH 27 (SUTURE) ×2
SUT VIC AB 3-0 SH 27X BRD (SUTURE) ×1 IMPLANT
SUT VICRYL 2 0 18  UND BR (SUTURE) ×2
SUT VICRYL 2 0 18 UND BR (SUTURE) ×1 IMPLANT
SYRINGE 10CC LL (SYRINGE) ×3 IMPLANT

## 2016-12-03 NOTE — Progress Notes (Signed)
Patient alert and oriented; c/o of pain; PCA pump initiated at ordered; NG tube patent and draining dark green secretions; and Foley patent; Spouse at bedside; no distress observed; remains NPO. Will continue to monitor.

## 2016-12-03 NOTE — Progress Notes (Signed)
Preoperative Review   Patient is met in the preoperative holding area. The history is reviewed in the chart and with the patient. I personally reviewed the options and rationale as well as the risks of this procedure that have been previously discussed with the patient. All questions asked by the patient and/or family were answered to their satisfaction.  Patient agrees to proceed with this procedure at this time.  [place] Spainhower E Lester Platas M.D. FACS  

## 2016-12-03 NOTE — Op Note (Signed)
   Pre-operative Diagnosis: Small bowel obstruction  Post-operative Diagnosis: Small bowel obstruction  Surgeon: Phoebe Perch   Assistants: Surgical tach  Procedure adhesiolyse   Procedure Details  The patient was seen again in the Holding Room. The benefits, complications, treatment options, and expected outcomes were discussed with the patient. The risks of bleeding, infection, recurrence of symptoms, failure to resolve symptoms,  bowel injury, any of which could require further surgery were reviewed with the patient.   The patient was taken to Operating Room, identified as Scott Gillespie. and the procedure verified.  A Time Out was held and the above information confirmed.  Prior to the induction of general anesthesia, antibiotic prophylaxis was administered. VTE prophylaxis was in place. General endotracheal anesthesia was then administered and tolerated well. After the induction, the abdomen was prepped with Chloraprep and draped in the sterile fashion. The patient was positioned in the supine position. A Foley catheter had been placed  A midline incision was utilized to open and explore the abdominal cavity adhesions were taken down sharply without the use of energy. The dilated bowel was identified and run down to the pelvis where there was a loop of bowel that appeared to enter the pelvis. Careful sharp dissection along the pelvic brim and lateral to the cecum allowed for elevation of the cecum and the hairpin scar 5 terminal ileum. There is no sign of bowel injury. Finally the end result was that the terminal ileum entered the cecum unimpeded.  The bowel was run multiple times from the terminal ileum to the ileocecal valve and no further pathology was identified there was no sign of bowel injury hemostasis was adequate and minimal blood loss had been encountered.  Sponge lap needle count was correct therefore a piece of Seprafilm was placed followed by closure of the abdominal  fascia with running #1 PDS. Marcaine was placed for a total of 30 cc followed by skin staples. Dressing was placed.  Patient tolerated the procedure well the workup occasions he was taken to recovery room in stable condition to be admitted for continued care  Findings: Dense adhesions in the right lower quadrant involving a hairpin adhesion of the terminal ileum to the pelvic brim resulting in a bowel obstruction   Estimated Blood Loss: Nil         Drains: None         Specimens: None        Complications: None                  Condition: Stable   Richard E. Burt Knack, MD, FACS

## 2016-12-03 NOTE — Anesthesia Postprocedure Evaluation (Signed)
Anesthesia Post Note  Patient: Scott Gillespie.  Procedure(s) Performed: Procedure(s) (LRB): EXPLORATORY LAPAROTOMY (N/A)  Patient location during evaluation: PACU Anesthesia Type: General Level of consciousness: awake and alert Pain management: pain level controlled Vital Signs Assessment: post-procedure vital signs reviewed and stable Respiratory status: spontaneous breathing and respiratory function stable Cardiovascular status: stable Anesthetic complications: no     Last Vitals:  Vitals:   12/03/16 1447 12/03/16 1501  BP: 137/79 135/80  Pulse: 82 85  Resp: (!) 22 18  Temp:      Last Pain:  Vitals:   12/03/16 1501  TempSrc:   PainSc: 8                  KEPHART,WILLIAM K

## 2016-12-03 NOTE — Progress Notes (Signed)
Abdominal films personally reviewed. Signs of continued bowel obstruction although there is improvement in the caliber etc. this is likely secondary to the nasogastric suction. There is still signs of persistent bowel obstruction.  Patient exam is unchanged with tympanitic abdomen nontender and distended.  I discussed with the patient and his wife the rationale for offering surgery at this point he has failed to improve completely and some of the improvement on his films is related to the nasogastric suction as opposed to resolution of the cause of his bowel obstruction he remains distended and tympanitic and is only passing minimal gas. With the findings on CT scan and of persistent bowel obstruction with nasogastric suction M recommending exploration. I discussed with them the rationale for surgery the options of observation the risks of bleeding infection recurrence and negative laparoscopy as well as bowel resection the understood and agreed to proceed

## 2016-12-03 NOTE — Progress Notes (Signed)
CC: SBO Subjective: Patient passed a minimal amount of gas and had diarrhea again with the passage of the gas. He has no abdominal pain today. No fevers or chills  Objective: Vital signs in last 24 hours: Temp:  [97.8 F (36.6 C)-98.2 F (36.8 C)] 98.2 F (36.8 C) (12/31 0522) Pulse Rate:  [69-111] 73 (12/31 0522) Resp:  [16-18] 18 (12/31 0522) BP: (107-135)/(55-95) 108/64 (12/31 0522) SpO2:  [94 %-100 %] 96 % (12/31 0522) Weight:  [200 lb (90.7 kg)] 200 lb (90.7 kg) (12/30 1526) Last BM Date: 12/02/16  Intake/Output from previous day: 12/30 0701 - 12/31 0700 In: 2902.1 [I.V.:1902.1; IV Piggyback:1000] Out: 1110 [Urine:1000; Emesis/NG output:110] Intake/Output this shift: No intake/output data recorded.  Physical exam:  Awake alert and oriented vital signs are stable and reviewed. Abdomen remains distended and tympanitic but nontender calves are nontender NG is in place  Lab Results: CBC   Recent Labs  12/02/16 0901 12/03/16 0705  WBC 10.1 7.5  HGB 17.2 13.9  HCT 49.1 39.2*  PLT 312 222   BMET  Recent Labs  12/02/16 0901 12/03/16 0705  NA 138 140  K 3.5 3.7  CL 104 109  CO2 27 28  GLUCOSE 101* 108*  BUN 21* 9  CREATININE 1.23 0.78  CALCIUM 9.7 8.5*   PT/INR No results for input(s): LABPROT, INR in the last 72 hours. ABG No results for input(s): PHART, HCO3 in the last 72 hours.  Invalid input(s): PCO2, PO2  Studies/Results: Dg Abdomen 1 View  Result Date: 12/02/2016 CLINICAL DATA:  NG tube placement. EXAM: ABDOMEN - 1 VIEW COMPARISON:  12/02/2016 at 1324 hours FINDINGS: NG tube tip passes chest through the GE junction. Side hole of the tube remains in the lower esophagus. Recommend further inserting, at least 5 cm, to allow the side hole to into the stomach. No other change from prior study. Persistent small bowel dilation consistent with a partial obstruction. IMPRESSION: NG tube tip projects just within the stomach. Recommend further inserting as  described above for more optimal positioning. Electronically Signed   By: Lajean Manes M.D.   On: 12/02/2016 14:47   Ct Abdomen Pelvis W Contrast  Result Date: 12/02/2016 CLINICAL DATA:  45 year old male with acute left lower quadrant abdominal pain, bloating and fever. Initial encounter. EXAM: CT ABDOMEN AND PELVIS WITH CONTRAST TECHNIQUE: Multidetector CT imaging of the abdomen and pelvis was performed using the standard protocol following bolus administration of intravenous contrast. CONTRAST:  131mL ISOVUE-300 IOPAMIDOL (ISOVUE-300) INJECTION 61% COMPARISON:  None. FINDINGS: Lower chest: Minor lung base atelectasis. No pericardial or pleural effusion. No upper abdominal free air. Hepatobiliary: Small volume of free fluid along the inferior right hepatic lobe. Otherwise negative liver and gallbladder. Pancreas: Negative. Spleen: Negative. Adrenals/Urinary Tract: Normal adrenal glands. Normal bilateral renal enhancement. There is little renal contrast excretion on delayed images. The IVC also is diminutive. Unremarkable urinary bladder. Stomach/Bowel: There is low-density fluid in the rectum. Sigmoid colon is redundant and contains fluid. No definite sigmoid diverticula. Both the rectal and sigmoid mucosa appears to be hyperenhancing. There is a smaller caliber to the mid sigmoid colon on series 2, image 84 but there is no discrete mass or obstructing lesion. Upstream of the sigmoid colon the left colon is intermittently distended with gas and fluid. There is gas distending the transverse colon. The hepatic flexure is redundant. There is fluid distending the right colon. There is a small volume of free fluid in the upper right gutter and along the  inferior right liver margin (series 2, image 38). Sequelae of appendectomy. The terminal ileum is decompressed. However, small bowel loops in the proximal third of the jejunum become dilated with fluid and air measuring up to 4 cm diameter. These loops remain  dilated into the right abdomen where there is a short segment of decompressed small bowel (on series 2, image 58), followed by a fluid-filled dilated loop up to 35 mm diameter which then tapers quickly just proximal to the terminal ileum in the vicinity of the appendectomy clip (series 2, image 77 and series 5, image 49). The proximal jejunum is decompressed. The stomach is decompressed and negative. Vascular/Lymphatic: Major arterial structures in the abdomen and pelvis are normal. Portal venous system is patent. Reproductive: Negative. Other: Small volume of free fluid in the mid and upper pelvis (series 2, image 85 and coronal image 65)). Musculoskeletal: Negative. IMPRESSION: 1. High high-grade small bowel obstruction with transition point in proximity to the appendectomy clip adjacent to the terminal ileum. Favor postoperative adhesion. 2. There is then superimposed gas and fluid distending most of the large bowel, such as due to a large bowel ileus with diarrhea. There is some hyper-enhancement of the sigmoid and rectal mucosa, but no overt colitis. No diverticula or diverticulitis. 3. Small volume of free fluid in the right abdomen and pelvis felt to be reactive to #1 and/or #2. No free air. Electronically Signed   By: Genevie Ann M.D.   On: 12/02/2016 12:40   Dg Abd 2 Views  Result Date: 12/02/2016 CLINICAL DATA:  Pt is complaining for several days of progressively worsening left lower quadrant pain as well as nausea and vomiting with diarrhea this morning. No blood in his stools. Has had low-grade fevers but no measured temperatures. States he's had crampy abdominal pain. States the pain is severe and unrelenting. There is no radiation. Denies any burning when he eased. No hematuria. Hx of appendectomy. EXAM: ABDOMEN - 2 VIEW COMPARISON:  Current abdomen pelvis CT FINDINGS: There are dilated loops of small bowel with air-fluid levels consistent with the bowel obstruction noted on the current CT. No free  air. There is air within the colon but no significant colon dilation. Residual contrast is noted in the renal collecting systems and bladder. IMPRESSION: 1. Dilated small bowel with air-fluid levels consistent with a partial small bowel obstruction as noted on the current abdomen pelvis CT. No free air. Electronically Signed   By: Lajean Manes M.D.   On: 12/02/2016 13:46    Anti-infectives: Anti-infectives    None      Assessment/Plan:  White blood count is normal. Abdomen exam remains distended and tympanitic. A KUB is currently pending. I believe that this patient will likely require an operation as he has not changed free much with NG suction for the last 18 hours. We will reserve decision on surgery until the KUB is complete and reviewed but will likely require surgery and this was discussed with he and his family member.  Florene Glen, MD, FACS  12/03/2016

## 2016-12-03 NOTE — Transfer of Care (Signed)
Immediate Anesthesia Transfer of Care Note  Patient: Scott Gillespie.  Procedure(s) Performed: Procedure(s): EXPLORATORY LAPAROTOMY (N/A)  Patient Location: PACU  Anesthesia Type:General  Level of Consciousness: patient cooperative and lethargic  Airway & Oxygen Therapy: Patient Spontanous Breathing and Patient connected to face mask oxygen  Post-op Assessment: Report given to RN and Post -op Vital signs reviewed and stable  Post vital signs: Reviewed and stable  Last Vitals:  Vitals:   12/03/16 1015 12/03/16 1416  BP: 126/73 (!) 143/76  Pulse: 69   Resp: 18 18  Temp: 36.8 C 36.7 C    Last Pain:  Vitals:   12/03/16 1015  TempSrc: Oral  PainSc:       Patients Stated Pain Goal: 0 ([phone])  Complications: No apparent anesthesia complications

## 2016-12-03 NOTE — Anesthesia Preprocedure Evaluation (Signed)
Anesthesia Evaluation  Patient identified by MRN, date of birth, ID band Patient awake    Reviewed: Allergy & Precautions, NPO status , Patient's Chart, lab work & pertinent test results  History of Anesthesia Complications Negative for: history of anesthetic complications  Airway Mallampati: II       Dental   Pulmonary neg pulmonary ROS, former smoker,           Cardiovascular negative cardio ROS       Neuro/Psych negative neurological ROS     GI/Hepatic Neg liver ROS, GERD  Medicated and Poorly Controlled,  Endo/Other  Hyperthyroidism   Renal/GU negative Renal ROS     Musculoskeletal   Abdominal   Peds  Hematology negative hematology ROS (+)   Anesthesia Other Findings   Reproductive/Obstetrics                             Anesthesia Physical Anesthesia Plan  ASA: II and emergent  Anesthesia Plan: General   Post-op Pain Management:    Induction: Intravenous and Rapid sequence  Airway Management Planned: Oral ETT  Additional Equipment:   Intra-op Plan:   Post-operative Plan:   Informed Consent: I have reviewed the patients History and Physical, chart, labs and discussed the procedure including the risks, benefits and alternatives for the proposed anesthesia with the patient or authorized representative who has indicated his/her understanding and acceptance.     Plan Discussed with:   Anesthesia Plan Comments:         Anesthesia Quick Evaluation

## 2016-12-03 NOTE — Anesthesia Procedure Notes (Signed)
Procedure Name: Intubation Date/Time: 12/03/2016 1:05 PM Performed by: Jonna Clark Pre-anesthesia Checklist: Patient identified, Patient being monitored, Timeout performed, Emergency Drugs available and Suction available Patient Re-evaluated:Patient Re-evaluated prior to inductionOxygen Delivery Method: Circle system utilized Preoxygenation: Pre-oxygenation with 100% oxygen Intubation Type: IV induction Ventilation: Mask ventilation without difficulty Laryngoscope Size: Miller and 2 Grade View: Grade I Tube type: Oral Tube size: 7.5 mm Number of attempts: 1 Placement Confirmation: ETT inserted through vocal cords under direct vision,  positive ETCO2 and breath sounds checked- equal and bilateral Secured at: 21 cm Tube secured with: Tape Dental Injury: Teeth and Oropharynx as per pre-operative assessment

## 2016-12-04 LAB — BASIC METABOLIC PANEL
ANION GAP: 5 (ref 5–15)
BUN: 7 mg/dL (ref 6–20)
CO2: 30 mmol/L (ref 22–32)
Calcium: 8.5 mg/dL — ABNORMAL LOW (ref 8.9–10.3)
Chloride: 104 mmol/L (ref 101–111)
Creatinine, Ser: 0.76 mg/dL (ref 0.61–1.24)
Glucose, Bld: 125 mg/dL — ABNORMAL HIGH (ref 65–99)
POTASSIUM: 4.1 mmol/L (ref 3.5–5.1)
Sodium: 139 mmol/L (ref 135–145)

## 2016-12-04 LAB — CBC
HCT: 38.4 % — ABNORMAL LOW (ref 40.0–52.0)
Hemoglobin: 13.8 g/dL (ref 13.0–18.0)
MCH: 30.9 pg (ref 26.0–34.0)
MCHC: 35.8 g/dL (ref 32.0–36.0)
MCV: 86.4 fL (ref 80.0–100.0)
PLATELETS: 232 10*3/uL (ref 150–440)
RBC: 4.45 MIL/uL (ref 4.40–5.90)
RDW: 12.9 % (ref 11.5–14.5)
WBC: 11.4 10*3/uL — AB (ref 3.8–10.6)

## 2016-12-04 NOTE — Progress Notes (Signed)
Foley cath dc'd as per md's orders. Pt tol well.

## 2016-12-04 NOTE — Progress Notes (Signed)
12/04/2016  Subjective: Patient is 1 Day Post-Op status post exploratory laparotomy for small bowel obstruction. No acute events overnight. Patient's pain is well-controlled. Denies any nausea. No flatus yet.  Vital signs: Temp:  [97.3 F (36.3 C)-98.7 F (37.1 C)] 98.2 F (36.8 C) (01/01 0757) Pulse Rate:  [64-89] 67 (01/01 0757) Resp:  [8-26] 8 (01/01 0757) BP: (107-151)/(60-89) 107/66 (01/01 0757) SpO2:  [90 %-100 %] 99 % (01/01 0757)   Intake/Output: 12/31 0701 - 01/01 0700 In: 4232.3 [I.V.:4132.3; IV Piggyback:100] Out: [id number] [Urine:2575; Emesis/NG output:450; Blood:10] Last BM Date: 12/02/16  Physical Exam: Constitutional: No acute distress Abdomen: Soft, nondistended, appropriately tender to palpation. Incisions clean dry and intact with dressing covering.  Labs:   Recent Labs  12/03/16 0705 12/04/16 0431  WBC 7.5 11.4*  HGB 13.9 13.8  HCT 39.2* 38.4*  PLT 222 232    Recent Labs  12/03/16 0705 12/04/16 0431  NA 140 139  K 3.7 4.1  CL 109 104  CO2 28 30  GLUCOSE 108* 125*  BUN 9 7  CREATININE 0.78 0.76  CALCIUM 8.5* 8.5*   No results for input(s): LABPROT, INR in the last 72 hours.  Imaging: No results found.  Assessment/Plan: 46 year old male status post exploratory laparotomy for small bowel obstruction.  -Continue NG tube to suction while awaiting for return of bowel function. -Continue IV fluid hydration and continue PCA for pain control. -Ambulate with assistance and ad lib today.   Melvyn Neth, [place]

## 2016-12-05 ENCOUNTER — Encounter: Payer: Self-pay | Admitting: Surgery

## 2016-12-05 LAB — BASIC METABOLIC PANEL
Anion gap: 4 — ABNORMAL LOW (ref 5–15)
BUN: 7 mg/dL (ref 6–20)
CHLORIDE: 104 mmol/L (ref 101–111)
CO2: 29 mmol/L (ref 22–32)
CREATININE: 0.84 mg/dL (ref 0.61–1.24)
Calcium: 8.6 mg/dL — ABNORMAL LOW (ref 8.9–10.3)
GFR calc Af Amer: >60 mL/min (ref >60)
Glucose, Bld: 119 mg/dL — ABNORMAL HIGH (ref 65–99)
Potassium: 3.4 mmol/L — ABNORMAL LOW (ref 3.5–5.1)
SODIUM: 137 mmol/L (ref 135–145)

## 2016-12-05 LAB — MAGNESIUM: MAGNESIUM: 1.7 mg/dL (ref 1.7–2.4)

## 2016-12-05 MED ORDER — PANTOPRAZOLE SODIUM 40 MG IV SOLR
40.0000 mg | INTRAVENOUS | Status: DC
  Administered 2016-12-05 – 2016-12-07 (×3): 40 mg via INTRAVENOUS
  Filled 2016-12-05 (×3): qty 40

## 2016-12-05 MED ORDER — POTASSIUM CHLORIDE CRYS ER 20 MEQ PO TBCR
40.0000 meq | EXTENDED_RELEASE_TABLET | Freq: Once | ORAL | Status: AC
  Administered 2016-12-05: 40 meq via ORAL
  Filled 2016-12-05: qty 2

## 2016-12-05 MED ORDER — KETOROLAC TROMETHAMINE 30 MG/ML IJ SOLN
30.0000 mg | Freq: Four times a day (QID) | INTRAMUSCULAR | Status: DC
Start: 2016-12-05 — End: 2016-12-07
  Administered 2016-12-05 – 2016-12-07 (×9): 30 mg via INTRAVENOUS
  Filled 2016-12-05 (×9): qty 1

## 2016-12-05 MED ORDER — HYDROMORPHONE HCL 1 MG/ML IJ SOLN: 0.5000 mg | INTRAMUSCULAR | Status: DC | PRN

## 2016-12-05 MED ORDER — MAGNESIUM SULFATE 2 GM/50ML IV SOLN
2.0000 g | Freq: Once | INTRAVENOUS | Status: AC
Start: 2016-12-05 — End: 2016-12-05
  Administered 2016-12-05: 2 g via INTRAVENOUS
  Filled 2016-12-05: qty 50

## 2016-12-05 NOTE — Progress Notes (Signed)
12/05/2016  Subjective: Status post exploratory laparotomy for small bowel obstruction. No acute events overnight. Patient has been ambulating with no issues. No bowel function yet. Denies any nausea or vomiting. Pain is well-controlled with PCA.  Vital signs: Temp:  [98.2 F (36.8 C)-98.5 F (36.9 C)] 98.3 F (36.8 C) (01/02 0737) Pulse Rate:  [73-78] 74 (01/02 0737) Resp:  [17-24] 21 (01/02 0738) BP: (118-128)/(64-71) 125/64 (01/02 0737) SpO2:  [92 %-100 %] 95 % (01/02 0738)   Intake/Output: 01/01 0701 - 01/02 0700 In: 3071 [I.V.:3021; NG/GT:50] Out: 2150 [Urine:1750; Emesis/NG output:400] Last BM Date: 12/02/16  Physical Exam: Constitutional: No acute distress Abdomen:  Soft, nondistended, appropriately tender to palpation over the midline incision. Dressing has been removed revealing an incision with staples which are clean dry and intact with no evidence of infection. NG tube is in place with gastric contents.  Labs:   Recent Labs  12/03/16 0705 12/04/16 0431  WBC 7.5 11.4*  HGB 13.9 13.8  HCT 39.2* 38.4*  PLT 222 232    Recent Labs  12/04/16 0431 12/05/16 0428  NA 139 137  K 4.1 3.4*  CL 104 104  CO2 30 29  GLUCOSE 125* 119*  BUN 7 7  CREATININE 0.76 0.84  CALCIUM 8.5* 8.6*   No results for input(s): LABPROT, INR in the last 72 hours.  Imaging: No results found.  Assessment/Plan: 46 year old male status post exploratory laparotomy for small bowel obstruction  -Continue nothing by mouth with IV fluid hydration. -Continue NG tube in place while awaiting for return of bowel function. -Continue pain control with PCA and we will transition to IV pain medication likely later today versus tomorrow   Melvyn Neth, [place]

## 2016-12-05 NOTE — Plan of Care (Signed)
Problem: Activity: Goal: Risk for activity intolerance will decrease Outcome: Progressing Patient has ambulated in hallway approximately over 900 feet.  Problem: Fluid Volume: Goal: Ability to maintain a balanced intake and output will improve Outcome: Not Progressing Patient is NPO except sips with meds.  Problem: Nutrition: Goal: Adequate nutrition will be maintained Outcome: Not Progressing Patient is NPO except sips with meds.

## 2016-12-05 NOTE — Progress Notes (Signed)
Patient ambulated in hallway 640 feet.  Tolerated walk well with minimal pain.  Patient is now resting comfortably.  Scott Gillespie  12/05/2016 3:46 AM

## 2016-12-05 NOTE — Progress Notes (Signed)
Pt ambulated multiple times around the nursing station, pt tolerated ambulation well. Will continue to encourage ambulation.   Scott Gillespie [employer]

## 2016-12-05 NOTE — Progress Notes (Signed)
PCA pump D/C per order. RN wasted 16 mL in the pxyis with [facility].   Ayden Apodaca [employer]

## 2016-12-06 LAB — BASIC METABOLIC PANEL
ANION GAP: 5 (ref 5–15)
BUN: 8 mg/dL (ref 6–20)
CHLORIDE: 105 mmol/L (ref 101–111)
CO2: 29 mmol/L (ref 22–32)
Calcium: 8.8 mg/dL — ABNORMAL LOW (ref 8.9–10.3)
Creatinine, Ser: 0.83 mg/dL (ref 0.61–1.24)
GFR calc Af Amer: >60 mL/min (ref >60)
GFR calc non Af Amer: >60 mL/min (ref >60)
GLUCOSE: 97 mg/dL (ref 65–99)
POTASSIUM: 4.1 mmol/L (ref 3.5–5.1)
Sodium: 139 mmol/L (ref 135–145)

## 2016-12-06 LAB — MAGNESIUM: Magnesium: 2.2 mg/dL (ref 1.7–2.4)

## 2016-12-06 NOTE — Progress Notes (Signed)
12/06/2016  Subjective: Patient is 3 Days Post-Op status post exploratory laparotomy for small bowel obstruction. Patient reports that he started passing some flatus overnight.  Denies any nausea or vomiting. Reports that his abdominal pain is much better.  Vital signs: Temp:  [97.6 F (36.4 C)-98.5 F (36.9 C)] 97.6 F (36.4 C) (01/03 0823) Pulse Rate:  [58-73] 58 (01/03 0823) Resp:  [16-21] 16 (01/03 0823) BP: (118-133)/(66-80) 124/80 (01/03 0823) SpO2:  [97 %-98 %] 97 % (01/03 0823)   Intake/Output: 01/02 0701 - 01/03 0700 In: 2970.7 [I.V.:2970.7] Out: 3075 [Urine:2475; Emesis/NG output:600] Last BM Date: 12/02/16  Physical Exam: Constitutional: No acute distress Abdomen: Soft, nondistended, properly tender to palpation. Incision is clean dry and intact with staples.  Labs:   Recent Labs  12/04/16 0431  WBC 11.4*  HGB 13.8  HCT 38.4*  PLT 232    Recent Labs  12/05/16 0428 12/06/16 0534  NA 137 139  K 3.4* 4.1  CL 104 105  CO2 29 29  GLUCOSE 119* 97  BUN 7 8  CREATININE 0.84 0.83  CALCIUM 8.6* 8.8*   No results for input(s): LABPROT, INR in the last 72 hours.  Imaging: No results found.  Assessment/Plan: 46 year old male status post exploratory laparotomy for small bowel obstruction.  -We'll do NG clamping trial today. If he has a low residual, NG tube will be removed today and patient may be started on ice chips and sips of water. -Continue ambulation.   Melvyn Neth, [place]

## 2016-12-06 NOTE — Plan of Care (Signed)
Problem: Bowel/Gastric: Goal: Will not experience complications related to bowel motility Outcome: Progressing Ambulating very well around nursing station many times; NGT with moderate output; Denies nausea/vomiting; passing flatus this am.

## 2016-12-07 LAB — CBC
HEMATOCRIT: 38.2 % — AB (ref 40.0–52.0)
HEMOGLOBIN: 13.2 g/dL (ref 13.0–18.0)
MCH: 30.8 pg (ref 26.0–34.0)
MCHC: 34.6 g/dL (ref 32.0–36.0)
MCV: 89.2 fL (ref 80.0–100.0)
Platelets: 282 10*3/uL (ref 150–440)
RBC: 4.29 MIL/uL — ABNORMAL LOW (ref 4.40–5.90)
RDW: 13.2 % (ref 11.5–14.5)
WBC: 6.8 10*3/uL (ref 3.8–10.6)

## 2016-12-07 MED ORDER — OXYCODONE HCL 5 MG PO TABS: 5.0000 mg | ORAL_TABLET | ORAL | 0 refills | Status: DC | PRN

## 2016-12-07 MED ORDER — OXYCODONE HCL 5 MG PO TABS
5.0000 mg | ORAL_TABLET | ORAL | Status: DC | PRN
  Administered 2016-12-07: 5 mg via ORAL
  Filled 2016-12-07: qty 1

## 2016-12-07 MED ORDER — IBUPROFEN 600 MG PO TABS: 600.0000 mg | ORAL_TABLET | Freq: Three times a day (TID) | ORAL | 0 refills | Status: AC | PRN

## 2016-12-07 NOTE — Progress Notes (Signed)
Patient discharge teaching given, including activity, diet, follow-up appoints, and medications. Patient verbalized understanding of all discharge instructions. IV access was d/c'd. Vitals are stable. Skin is intact except as charted in most recent assessments. Pt driven home by wife.  Scott Gillespie [employer]

## 2016-12-07 NOTE — Progress Notes (Signed)
12/07/2016  Subjective: Patient is 4 Days Post-Op status post exploratory laparotomy for small bowel obstruction. No acute events overnight. Patient tolerated clear liquid diet with no issues. Had a bowel movement yesterday. Continues to have flatus. Pain is well-controlled. Denies any fevers, chills, nausea, vomiting.  Vital signs: Temp:  [98.1 F (36.7 C)-98.3 F (36.8 C)] 98.1 F (36.7 C) (01/04 0531) Pulse Rate:  [60-67] 64 (01/04 0531) Resp:  [20] 20 (01/04 0531) BP: (112-132)/(84-88) 112/84 (01/04 0531) SpO2:  [98 %-100 %] 98 % (01/04 0531)   Intake/Output: 01/03 0701 - 01/04 0700 In: [id number] [I.V.:1654] Out: 775 [Urine:775] Last BM Date: 12/07/15  Physical Exam: Constitutional: No acute distress Abdomen:  Soft, nondistended, properly tender to palpation. Incision is clean dry and intact with staples.  Labs:   Recent Labs  12/07/16 0435  WBC 6.8  HGB 13.2  HCT 38.2*  PLT 282    Recent Labs  12/05/16 0428 12/06/16 0534  NA 137 139  K 3.4* 4.1  CL 104 105  CO2 29 29  GLUCOSE 119* 97  BUN 7 8  CREATININE 0.84 0.83  CALCIUM 8.6* 8.8*   No results for input(s): LABPROT, INR in the last 72 hours.  Imaging: No results found.  Assessment/Plan: 46 year old male status post exploratory laparotomy for small bowel obstruction.  -Advance patient's diet to regular this morning. -Discontinue IV fluids. -Start oral pain medication. -Possible discharge to home this afternoon if the patient's doing well   Melvyn Neth, [place]

## 2016-12-07 NOTE — Discharge Summary (Signed)
Patient ID: Scott Gillespie. MRN: [id number] DOB/AGE: 1971-08-21 46 y.o.  Admit date: 12/02/2016 Discharge date: 12/07/2016   Discharge Diagnoses:  Active Problems:   SBO (small bowel obstruction)   Procedures: Exploratory laparotomy with lysis of adhesions  Hospital Course: Patient was admitted on 12/02/16 with a small bowel obstruction.  He was taken to the operating room on 12/31 due to no improvement despite of NG tube decompression.  He tolerated the procedure well.  Post-operatively, he was NPO with IV fluid hydration and NG tube in place, with PCA for pain control.  He had return of bowel function by POD#3 and NG tube was removed after a successful NG clamping trial.  He was started on clears and advanced to regular diet.  PCA was transitioned to po medications.  He was ambulating, tolerating a regular diet, with bowel function, voiding without issues, and his pain was well controlled.  He was deemed ready for discharge to home.   Consults: None  Disposition: 01-Home or Self Care  Discharge Instructions    Call MD for:  difficulty breathing, headache or visual disturbances    Complete by:  As directed    Call MD for:  persistant nausea and vomiting    Complete by:  As directed    Call MD for:  redness, tenderness, or signs of infection (pain, swelling, redness, odor or green/yellow discharge around incision site)    Complete by:  As directed    Call MD for:  severe uncontrolled pain    Complete by:  As directed    Call MD for:  temperature >100.4    Complete by:  As directed    Diet - low sodium heart healthy    Complete by:  As directed    Discharge instructions    Complete by:  As directed    Patient may shower, but do not scrub wound heavily and dab dry only.  Do not remove staples.   Driving Restrictions    Complete by:  As directed    Do not drive while taking narcotics for pain control.   Increase activity slowly    Complete by:  As directed    Lifting  restrictions    Complete by:  As directed    No heavy lifting of more than 10-15 lbs for 4 weeks.   No dressing needed    Complete by:  As directed      Allergies as of 12/07/2016   No Known Allergies     Medication List    TAKE these medications   cetirizine 10 MG tablet Commonly known as:  ZYRTEC Take 10 mg by mouth daily.   CRESTOR PO Take 10 mg by mouth daily.   esomeprazole 40 MG capsule Commonly known as:  NEXIUM Take 40 mg by mouth daily at 12 noon.   fluticasone 50 MCG/ACT nasal spray Commonly known as:  FLONASE Place 1 spray into both nostrils 2 (two) times daily.   ibuprofen 600 MG tablet Commonly known as:  ADVIL,MOTRIN Take 1 tablet (600 mg total) by mouth every 8 (eight) hours as needed for fever or mild pain.   levothyroxine 150 MCG tablet Commonly known as:  SYNTHROID, LEVOTHROID Take 150 mcg by mouth daily before breakfast.   oxyCODONE 5 MG immediate release tablet Commonly known as:  Oxy IR/ROXICODONE Take 1-2 tablets (5-10 mg total) by mouth every 4 (four) hours as needed for moderate pain or severe pain.   sodium chloride 0.65 % Soln nasal  spray Commonly known as:  OCEAN Place 2 sprays into both nostrils every 2 (two) hours while awake.      Follow-up Information    Phoebe Perch, MD Follow up in 1 week(s).   Specialty:  Surgery Why:  for staple removal Contact information: [address] [place] [address] [phone]

## 2016-12-07 NOTE — Plan of Care (Signed)
Problem: Activity: Goal: Risk for activity intolerance will decrease Outcome: Progressing Patient ambulates in hallway.

## 2016-12-13 ENCOUNTER — Other Ambulatory Visit: Payer: Self-pay

## 2016-12-14 ENCOUNTER — Encounter: Payer: Self-pay | Admitting: Surgery

## 2016-12-14 ENCOUNTER — Ambulatory Visit (INDEPENDENT_AMBULATORY_CARE_PROVIDER_SITE_OTHER): Payer: BLUE CROSS/BLUE SHIELD | Admitting: Surgery

## 2016-12-14 VITALS — BP 122/78 | HR 68 | Temp 97.6°F | Ht 71.0 in | Wt 205.0 lb

## 2016-12-14 DIAGNOSIS — Z8719 Personal history of other diseases of the digestive system: Secondary | ICD-10-CM

## 2016-12-14 DIAGNOSIS — Z9889 Other specified postprocedural states: Secondary | ICD-10-CM

## 2016-12-14 NOTE — Patient Instructions (Addendum)
We have removed your staples today and placed steri strips. These will fall off in 7-10 days. You may shower but please pat dry. Please call our office if you have any questions or concerns. You may try Miralax for your constipation. Please increase water to 72 ounces daily to help also.

## 2016-12-14 NOTE — Progress Notes (Signed)
12/14/2016  HPI: Patient is status post exploratory laparotomy for small bowel obstruction on 12/31 with Dr. Burt Knack. Patient was discharged on 1/4 presents today for follow-up appointment. He reports having some constipation and occasional soreness over the incision but otherwise has been doing well. He is asking whether he can drive when he can go back to work.  Vital signs: BP 122/78   Pulse 68   Temp 97.6 F (36.4 C) (Oral)   Ht 5\' 11"  (1.803 m)   Wt 93 kg (205 lb)   BMI 28.59 kg/m    Physical Exam: Constitutional: No acute distress Abdomen:  Abdomen is soft, nondistended, and minimally tender to palpation. Incision is clean dry and intact with no evidence of infection. Staples are in place and these have just been removed and changed to Steri-Strips.  Assessment/Plan: 89 row male status post exploratory laparotomy for small bowel obstruction.  -Patient is been reassured that his incisions healing well with no complications. Steri-Strips have been placed and instructions given to the patient. He may shower but do not scrub wound heavily and dab dry only. -Patient can take MiraLAX for his constipation once daily. -Had discussed with the patient that he can drive if he is no longer needing the oxycodone for pain control and also prior to driving to sit in the driver's seat and make sure he can turn the right and left to check his blind spots and checked behind the car. If he is not able to do this because of any tenderness or pain then he should not drive yet. The patient understands these instructions. -Patient will return back to work next Monday 1/15 for half days and for work on 1/22. Work note has been giving updating this. -Patient may return to clinic on an as-needed basis. Information regarding signs and symptoms to look out for including redness of the incision, swelling, worse tenderness, purulent drainage, or other concerns have been discussed with the patient and he noticed to  call or come back to the office of any issues or concerns.   Melvyn Neth, [place]

## 2016-12-15 ENCOUNTER — Telehealth: Payer: Self-pay | Admitting: Surgery

## 2016-12-15 NOTE — Telephone Encounter (Signed)
Patient had surgery for intestinal blockage. His child tested positive for the flu. He wants to know should he have tamaflu called in to prevent him getting it.

## 2016-12-18 NOTE — Telephone Encounter (Signed)
Returned phone call to patient. No answer. Left voicemail stating that Tamiflu is not taken unless positive for Influenza and this does not need to be called in for him. Asked for patient to return phone call with any further questions.

## 2017-01-08 ENCOUNTER — Telehealth: Payer: Self-pay

## 2017-01-08 NOTE — Telephone Encounter (Signed)
Patient's wife Anderson Malta) called wanting to know if I had filled out her husband's FMLA Forms. I told her that I had not received his FMLA Forms so I asked Anderson Malta to refaxed it at our [phone] number. I told her to fax it attention to me. I also informed her that there was a $25 fee and if she wanted this faxed today, she would have to pay it when I called her back. Anderson Malta agreed.

## 2017-01-08 NOTE — Telephone Encounter (Signed)
Pt calling to check status of FMLA paperwork. Please call pt's wife, Anderson Malta and advise.

## 2017-01-09 NOTE — Telephone Encounter (Signed)
FMLA Paperwork has been received and a $25.00 collection fee has been obtained.    

## 2017-01-11 ENCOUNTER — Telehealth: Payer: Self-pay

## 2017-01-11 NOTE — Telephone Encounter (Signed)
Disability Paperwork has been received and a $25.00 collection fee has been obtained.  

## 2017-01-12 ENCOUNTER — Telehealth: Payer: Self-pay

## 2017-01-12 NOTE — Telephone Encounter (Signed)
Patient's disability form was faxed on 01/08/2017. Patient's wife was notified.

## 2018-03-27 IMAGING — DX DG ABDOMEN 1V
1 series · 1 of 1 positions shown · non-contrast
Comparison: 12/02/2016 at 6775 hours

CLINICAL DATA: NG tube placement.

EXAM:
ABDOMEN - 1 VIEW

[abdomen kub]
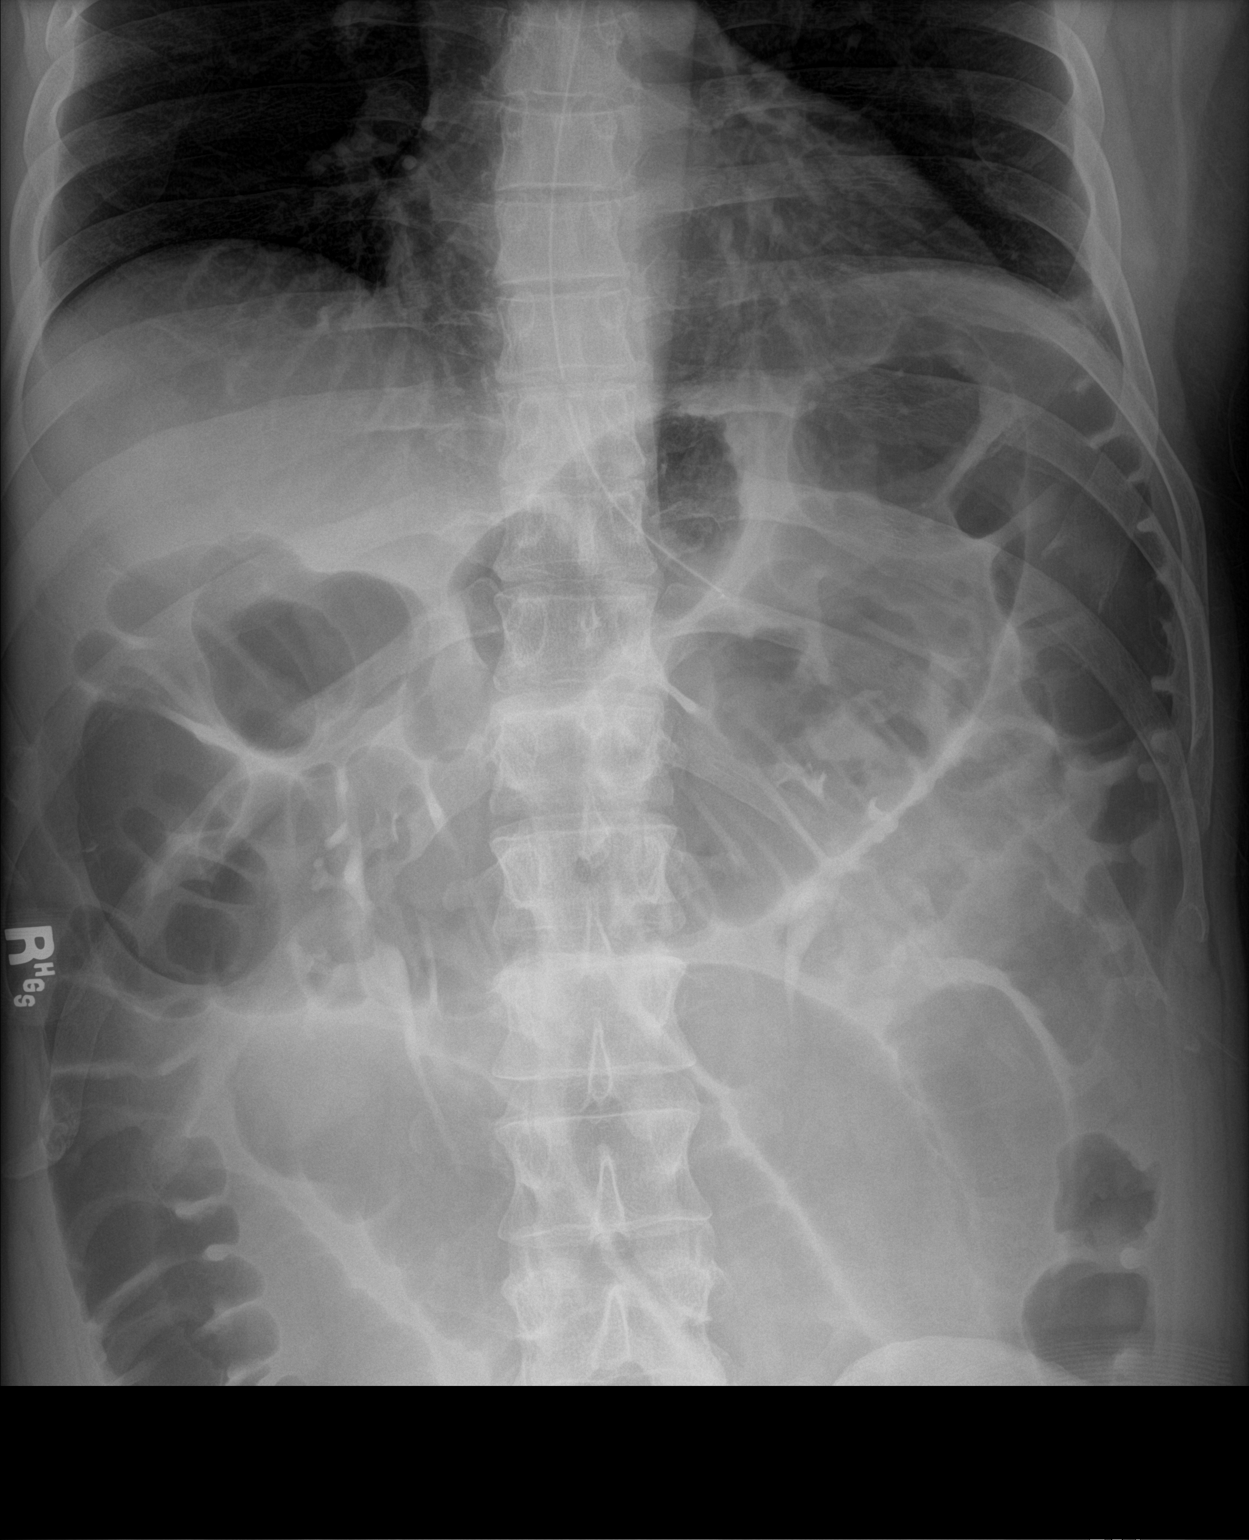

[1 of 1 positions shown; findings below may reference images not displayed]

FINDINGS: NG tube tip passes chest through the GE junction. Side hole of the
tube remains in the lower esophagus. Recommend further inserting, at
least 5 cm, to allow the side hole to into the stomach.

No other change from prior study. Persistent small bowel dilation
consistent with a partial obstruction.
IMPRESSION: NG tube tip projects just within the stomach. Recommend further
inserting as described above for more optimal positioning.

## 2018-03-27 IMAGING — CT CT ABD-PELV W/ CM
2 of 5 series · 14 of 46 positions shown, 16 images · IV contrast (iopamidol)
Comparison: None.

CLINICAL DATA: 45-year-old male with acute left lower quadrant
abdominal pain, bloating and fever. Initial encounter.

EXAM:
CT ABDOMEN AND PELVIS WITH CONTRAST
TECHNIQUE: Multidetector CT imaging of the abdomen and pelvis was performed
using the standard protocol following bolus administration of
intravenous contrast.
CONTRAST:  100mL [id number] IOPAMIDOL ([id number]-KFF) INJECTION 61%

[Series 2: routine abd/pel with · axial · 0.83mm/px · z∈[-1337,-847]mm · 11 of 110 slices shown, 13 images]
[im 6/110  soft-tissue]
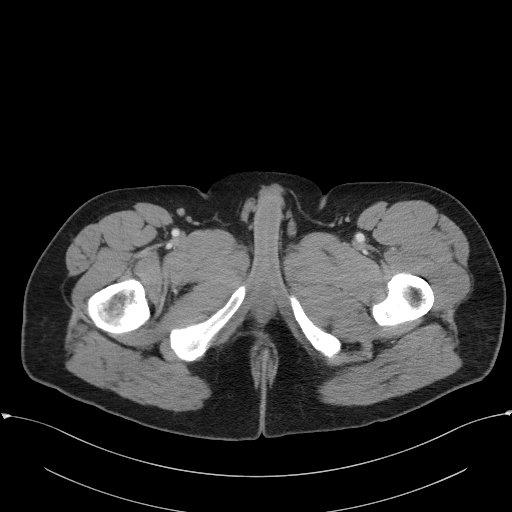
[im 6/110  bone]
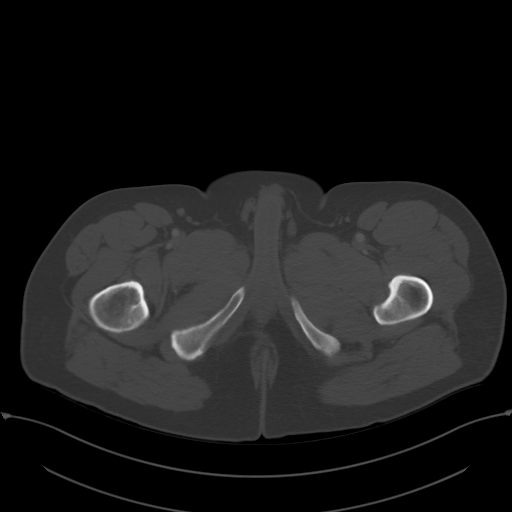
[im 17/110  soft-tissue]
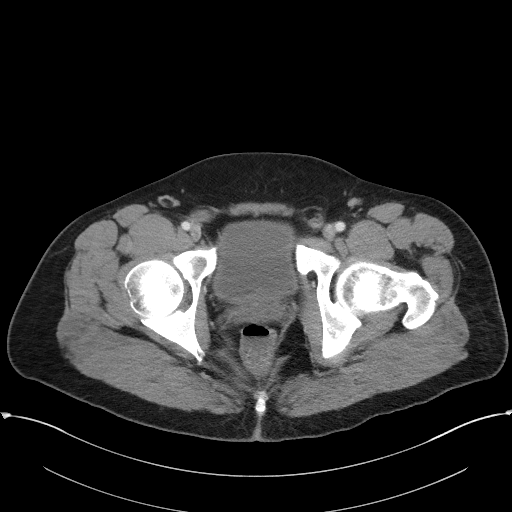
[im 28/110  soft-tissue]
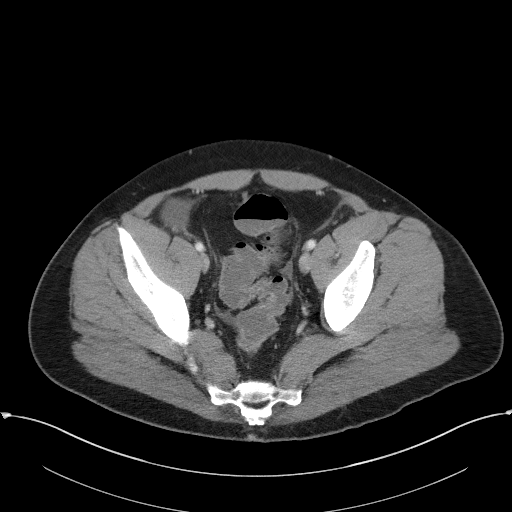
[im 39/110  soft-tissue]
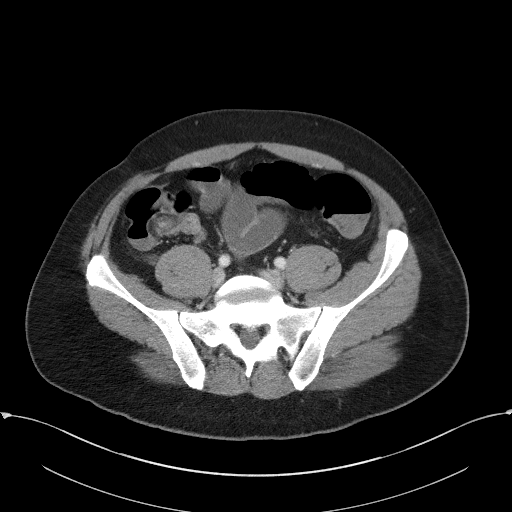
[im 44/110  soft-tissue]
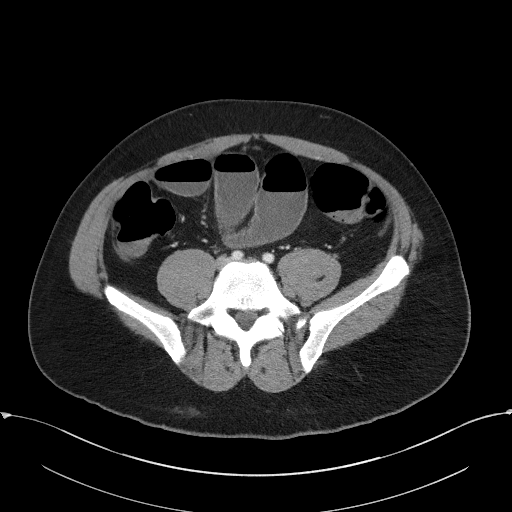
[im 55/110  soft-tissue]
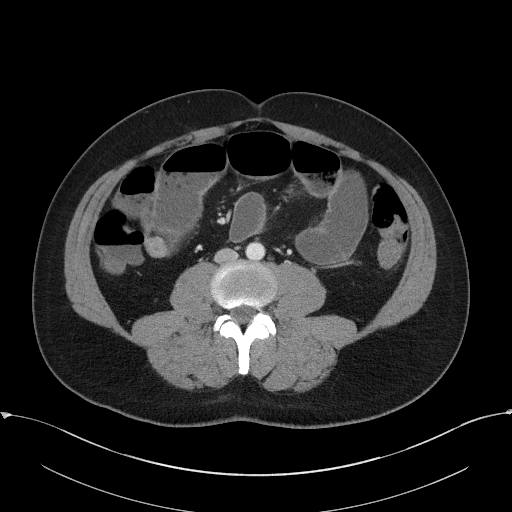
[im 66/110  soft-tissue]
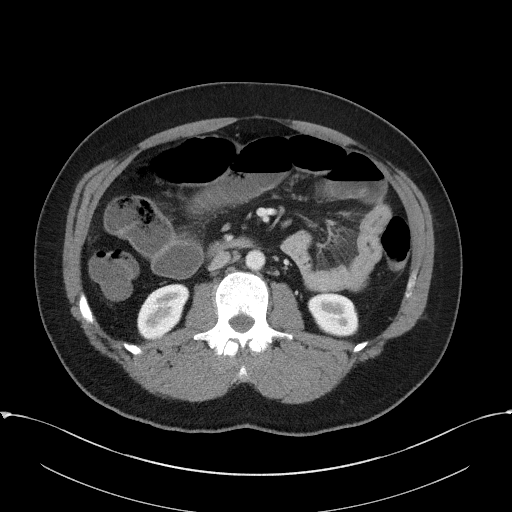
[im 71/110  soft-tissue]
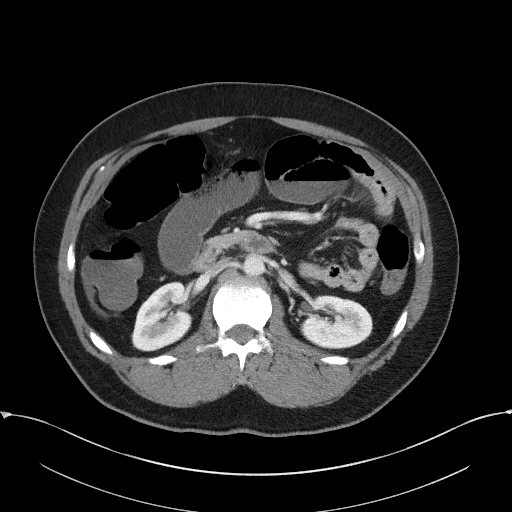
[im 82/110  soft-tissue]
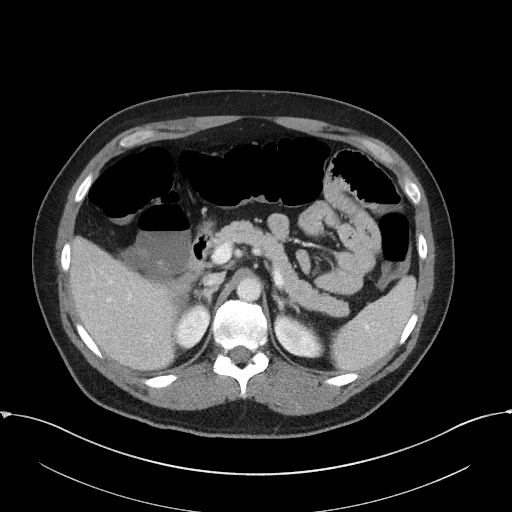
[im 82/110  bone]
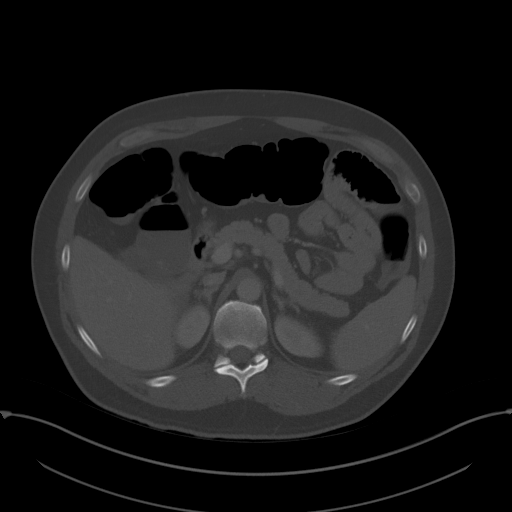
[im 93/110  soft-tissue]
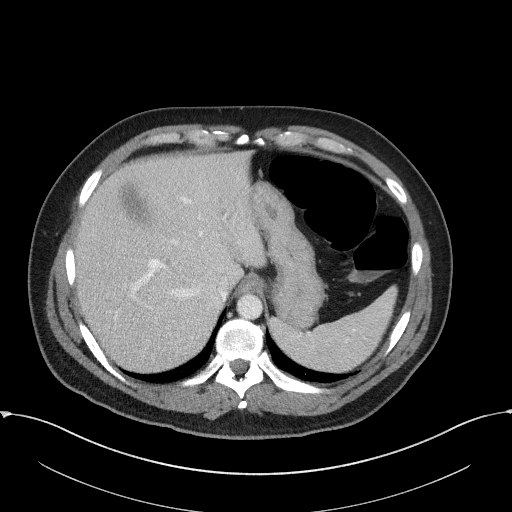
[im 104/110  soft-tissue]
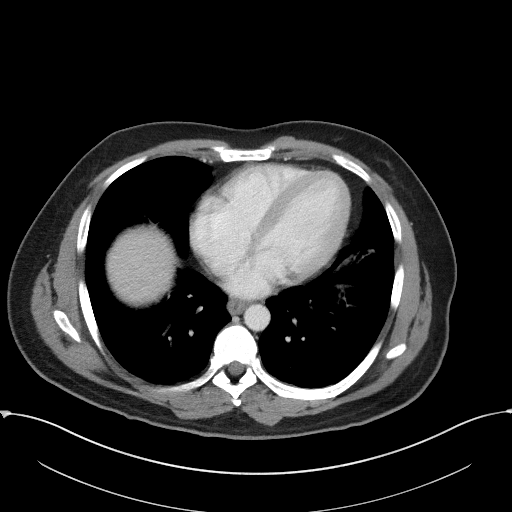

[Series 5: coronal st · coronal · 0.74mm/px · 3 of 98 slices shown]
[im 33/98  soft-tissue]
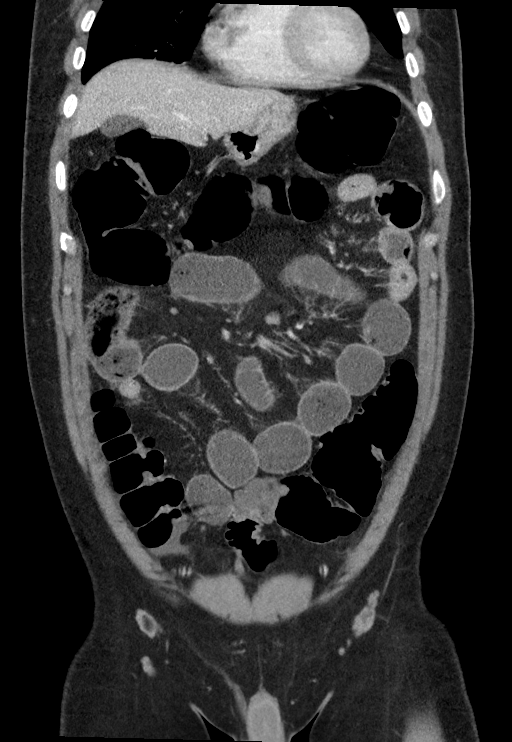
[im 44/98  soft-tissue]
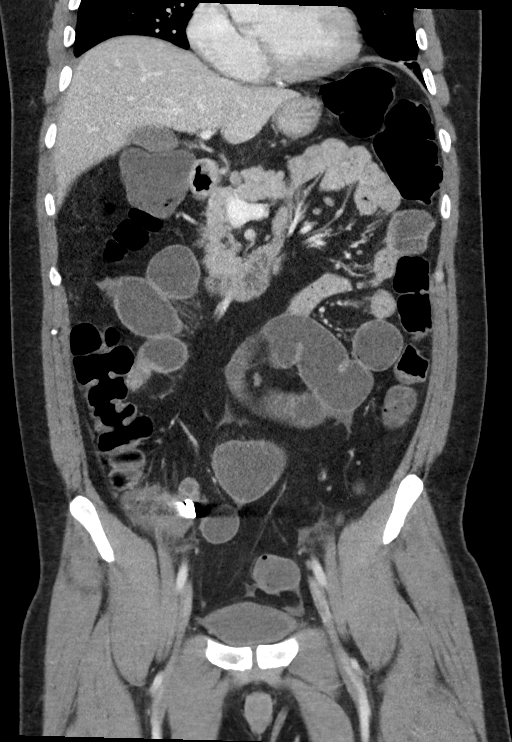
[im 54/98  soft-tissue]
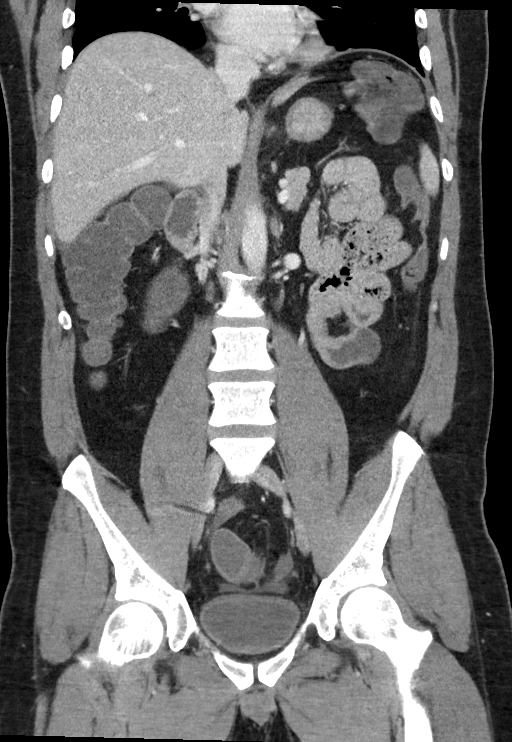

[14 of 46 positions shown; findings below may reference images not displayed]

FINDINGS: Lower chest: Minor lung base atelectasis. No pericardial or pleural
effusion.

No upper abdominal free air.

Hepatobiliary: Small volume of free fluid along the inferior right
hepatic lobe. Otherwise negative liver and gallbladder.

Pancreas: Negative.

Spleen: Negative.

Adrenals/Urinary Tract: Normal adrenal glands. Normal bilateral
renal enhancement. There is little renal contrast excretion on
delayed images. The IVC also is diminutive. Unremarkable urinary
bladder.

Stomach/Bowel: There is low-density fluid in the rectum.

Sigmoid colon is redundant and contains fluid. No definite sigmoid
diverticula. Both the rectal and sigmoid mucosa appears to be
hyperenhancing. There is a smaller caliber to the mid sigmoid colon
on series 2, image 84 but there is no discrete mass or obstructing
lesion.

Upstream of the sigmoid colon the left colon is intermittently
distended with gas and fluid. There is gas distending the transverse
colon. The hepatic flexure is redundant. There is fluid distending
the right colon. There is a small volume of free fluid in the upper
right gutter and along the inferior right liver margin (series 2,
image 38). Sequelae of appendectomy. The terminal ileum is
decompressed.

However, small bowel loops in the proximal third of the jejunum
become dilated with fluid and air measuring up to 4 cm diameter.
These loops remain dilated into the right abdomen where there is a
short segment of decompressed small bowel (on series 2, image 58),
followed by a fluid-filled dilated loop up to 35 mm diameter which
then tapers quickly just proximal to the terminal ileum in the
vicinity of the appendectomy clip (series 2, image 77 and series 5,
image 49).

The proximal jejunum is decompressed. The stomach is decompressed
and negative.

Vascular/Lymphatic: Major arterial structures in the abdomen and
pelvis are normal. Portal venous system is patent.

Reproductive: Negative.

Other: Small volume of free fluid in the mid and upper pelvis
(series 2, image 85 and coronal image 65)).

Musculoskeletal: Negative.
IMPRESSION: 1. High high-grade small bowel obstruction with transition point in
proximity to the appendectomy clip adjacent to the terminal ileum.
Favor postoperative adhesion.
2. There is then superimposed gas and fluid distending most of the
large bowel, such as due to a large bowel ileus with diarrhea. There
is some hyper-enhancement of the sigmoid and rectal mucosa, but no
overt colitis. No diverticula or diverticulitis.
3. Small volume of free fluid in the right abdomen and pelvis felt
to be reactive to #1 and/or #2. No free air.

## 2020-04-14 ENCOUNTER — Ambulatory Visit (INDEPENDENT_AMBULATORY_CARE_PROVIDER_SITE_OTHER): Payer: BC Managed Care – PPO | Admitting: Dermatology

## 2020-04-14 ENCOUNTER — Other Ambulatory Visit: Payer: Self-pay

## 2020-04-14 DIAGNOSIS — C441122 Basal cell carcinoma of skin of right lower eyelid, including canthus: Secondary | ICD-10-CM | POA: Diagnosis not present

## 2020-04-14 DIAGNOSIS — D492 Neoplasm of unspecified behavior of bone, soft tissue, and skin: Secondary | ICD-10-CM

## 2020-04-14 DIAGNOSIS — C4491 Basal cell carcinoma of skin, unspecified: Secondary | ICD-10-CM

## 2020-04-14 DIAGNOSIS — L820 Inflamed seborrheic keratosis: Secondary | ICD-10-CM

## 2020-04-14 HISTORY — DX: Basal cell carcinoma of skin, unspecified: C44.91

## 2020-04-14 NOTE — Progress Notes (Signed)
   New Patient Visit  Subjective  Scott Gillespie. is a 49 y.o. male who presents for the following: check spots (face, no symptoms) and check spot (L side, 3-4 yrs, hx of itching).   The following portions of the chart were reviewed this encounter and updated as appropriate:  Tobacco  Allergies  Meds  Problems  Med Hx  Surg Hx  Fam Hx      Review of Systems:  No other skin or systemic complaints except as noted in HPI or Assessment and Plan.  Objective  Well appearing patient in no apparent distress; mood and affect are within normal limits.  A focused examination was performed including face, trunk, R leg. Relevant physical exam findings are noted in the Assessment and Plan.  Objective  Right Popliteal Fossa x 2, L flank x 1, R infraorbital lat nose x 1, R neck x 1 (5): Erythematous keratotic or waxy stuck-on papule or plaque.   Objective  R lat lower eyelid: 0.3cm pink pap   Assessment & Plan  Inflamed seborrheic keratosis (5) Right Popliteal Fossa x 2, L flank x 1, R infraorbital lat nose x 1, R neck x 1  Destruction of lesion - Right Popliteal Fossa x 2, L flank x 1, R infraorbital lat nose x 1, R neck x 1 Complexity: simple   Destruction method: cryotherapy   Informed consent: discussed and consent obtained   Timeout:  patient name, date of birth, surgical site, and procedure verified Lesion destroyed using liquid nitrogen: Yes   Region frozen until ice ball extended beyond lesion: Yes   Outcome: patient tolerated procedure well with no complications   Post-procedure details: wound care instructions given    Neoplasm of skin R lat lower eyelid  Skin / nail biopsy Type of biopsy: tangential   Informed consent: discussed and consent obtained   Timeout: patient name, date of birth, surgical site, and procedure verified   Procedure prep:  Patient was prepped and draped in usual sterile fashion Prep type:  Isopropyl alcohol Anesthesia: the lesion was  anesthetized in a standard fashion   Anesthetic:  1% lidocaine w/ epinephrine 1-100,000 buffered w/ 8.4% NaHCO3 Instrument used: scissors   Outcome: patient tolerated procedure well   Post-procedure details: sterile dressing applied and wound care instructions given   Dressing type: bandage and petrolatum    Specimen 1 - Surgical pathology Differential Diagnosis: D48.5 Fibrous pap r/o BCC Check Margins: No 0.3cm pink pap  Return in about 4 months (around 08/15/2020) for recheck ISKs and bx .   I, Othelia Pulling, RMA, am acting as scribe for Sarina Ser, MD .  Documentation: I have reviewed the above documentation for accuracy and completeness, and I agree with the above.  Sarina Ser, MD

## 2020-04-14 NOTE — Patient Instructions (Addendum)
Wound Care Instructions  1. Cleanse wound gently with soap and water once a day then pat dry with clean gauze. Apply a thing coat of Petrolatum (petroleum jelly, "Vaseline") over the wound (unless you have an allergy to this). We recommend that you use a new, sterile tube of Vaseline. Do not pick or remove scabs. Do not remove the yellow or white "healing tissue" from the base of the wound.  2. Cover the wound with fresh, clean, nonstick gauze and secure with paper tape. You may use Band-Aids in place of gauze and tape if the would is small enough, but would recommend trimming much of the tape off as there is often too much. Sometimes Band-Aids can irritate the skin.  3. You should call the office for your biopsy report after 1 week if you have not already been contacted.  4. If you experience any problems, such as abnormal amounts of bleeding, swelling, significant bruising, significant pain, or evidence of infection, please call the office immediately.  5. FOR ADULT SURGERY PATIENTS: If you need something for pain relief you may take 1 extra strength Tylenol (acetaminophen) AND 2 Ibuprofen (200mg each) together every 4 hours as needed for pain. (do not take these if you are allergic to them or if you have a reason you should not take them.) Typically, you may only need pain medication for 1 to 3 days.      Seborrheic Keratosis  What causes seborrheic keratoses? Seborrheic keratoses are harmless, common skin growths that first appear during adult life.  As time goes by, more growths appear.  Some people may develop a large number of them.  Seborrheic keratoses appear on both covered and uncovered body parts.  They are not caused by sunlight.  The tendency to develop seborrheic keratoses can be inherited.  They vary in color from skin-colored to gray, brown, or even black.  They can be either smooth or have a rough, warty surface.   Seborrheic keratoses are superficial and look as if they were  stuck on the skin.  Under the microscope this type of keratosis looks like layers upon layers of skin.  That is why at times the top layer may seem to fall off, but the rest of the growth remains and re-grows.    Treatment Seborrheic keratoses do not need to be treated, but can easily be removed in the office.  Seborrheic keratoses often cause symptoms when they rub on clothing or jewelry.  Lesions can be in the way of shaving.  If they become inflamed, they can cause itching, soreness, or burning.  Removal of a seborrheic keratosis can be accomplished by freezing, burning, or surgery. If any spot bleeds, scabs, or grows rapidly, please return to have it checked, as these can be an indication of a skin cancer.   

## 2020-04-20 ENCOUNTER — Telehealth: Payer: Self-pay

## 2020-04-20 NOTE — Telephone Encounter (Signed)
-----   Message from Ralene Bathe, MD sent at 04/20/2020 12:48 PM EDT ----- Skin , right lat lower eyelid BASAL CELL CARCINOMA, INFILTRATIVE PATTERN  Cancer - BCC - infiltrative Schedule for MOHS

## 2020-04-20 NOTE — Telephone Encounter (Signed)
LM on VM to return my call. 

## 2020-04-21 ENCOUNTER — Encounter: Payer: Self-pay | Admitting: Dermatology

## 2020-04-28 ENCOUNTER — Telehealth: Payer: Self-pay

## 2020-04-28 NOTE — Telephone Encounter (Signed)
Patient advised of BX results. He would like to have referral sent to Dr. Lacinda Axon at [facility] for Mohs. He is going to send me a photo in a email so I can send to their office.

## 2020-04-28 NOTE — Telephone Encounter (Signed)
Phone line disconnected after a male answered the phone.

## 2020-04-28 NOTE — Telephone Encounter (Signed)
-----   Message from Ralene Bathe, MD sent at 04/20/2020 12:48 PM EDT ----- Skin , right lat lower eyelid BASAL CELL CARCINOMA, INFILTRATIVE PATTERN  Cancer - BCC - infiltrative Schedule for MOHS

## 2020-04-29 ENCOUNTER — Other Ambulatory Visit: Payer: Self-pay

## 2020-04-29 DIAGNOSIS — C44319 Basal cell carcinoma of skin of other parts of face: Secondary | ICD-10-CM

## 2020-08-16 ENCOUNTER — Ambulatory Visit (INDEPENDENT_AMBULATORY_CARE_PROVIDER_SITE_OTHER): Payer: BC Managed Care – PPO | Admitting: Dermatology

## 2020-08-16 ENCOUNTER — Other Ambulatory Visit: Payer: Self-pay

## 2020-08-16 DIAGNOSIS — L820 Inflamed seborrheic keratosis: Secondary | ICD-10-CM

## 2020-08-16 DIAGNOSIS — L219 Seborrheic dermatitis, unspecified: Secondary | ICD-10-CM | POA: Diagnosis not present

## 2020-08-16 DIAGNOSIS — L720 Epidermal cyst: Secondary | ICD-10-CM | POA: Diagnosis not present

## 2020-08-16 DIAGNOSIS — Z85828 Personal history of other malignant neoplasm of skin: Secondary | ICD-10-CM | POA: Diagnosis not present

## 2020-08-16 MED ORDER — HYDROCORTISONE 2.5 % EX CREA: TOPICAL_CREAM | CUTANEOUS | 0 refills | Status: DC

## 2020-08-16 MED ORDER — KETOCONAZOLE 2 % EX CREA: TOPICAL_CREAM | CUTANEOUS | 0 refills | Status: DC

## 2020-08-16 NOTE — Progress Notes (Signed)
   Follow-Up Visit   Subjective  Scott Gillespie. is a 49 y.o. male who presents for the following: recheck ISK's and recheck BCC (R lat lower eyelid - S/P MOHs ).  The following portions of the chart were reviewed this encounter and updated as appropriate:  Tobacco  Allergies  Meds  Problems  Med Hx  Surg Hx  Fam Hx     Review of Systems:  No other skin or systemic complaints except as noted in HPI or Assessment and Plan.  Objective  Well appearing patient in no apparent distress; mood and affect are within normal limits.  A focused examination was performed including the face and extremities. Relevant physical exam findings are noted in the Assessment and Plan.  Objective  Right Lower Eyelid : Smooth white papule(s).   Objective  Face and beard area: Pink patches with greasy scale.   Objective  R lat nose infraorbital, L flank: Stuck-on, waxy, tan-brown papules and plaques -- Discussed benign etiology and prognosis.    Assessment & Plan  Milium Right Lower Eyelid   Benign, observe.    Seborrheic dermatitis Face and beard area  Start Ketoconazole 2% cream on M, W, and F alternating with HC 2.5% cream on T, Th, Sat.  ketoconazole (NIZORAL) 2 % cream - Face and beard area  hydrocortisone 2.5 % cream - Face and beard area  Inflamed seborrheic keratosis R lat nose infraorbital, L flank  Persistent - Previously treated with Ln2 (ISK). Patient prefers to wait until f/u to treat    History of Basal Cell Carcinoma of the Skin - R lat lower eyelid S/P [facility] 07/22/20  - No evidence of recurrence today; area still healing - Recommend regular full body skin exams - Recommend daily broad spectrum sunscreen SPF 30+ to sun-exposed areas, reapply every 2 hours as needed.  - Call if any new or changing lesions are noted between office visits  Return in about 6 weeks (around 09/27/2020) for ISK treatment and recheck seb.derm .  Luther Redo, CMA, am acting as  scribe for Sarina Ser, MD .  Documentation: I have reviewed the above documentation for accuracy and completeness, and I agree with the above.  Sarina Ser, MD

## 2020-08-17 ENCOUNTER — Encounter: Payer: Self-pay | Admitting: Dermatology

## 2020-09-27 ENCOUNTER — Other Ambulatory Visit: Payer: Self-pay

## 2020-09-27 ENCOUNTER — Ambulatory Visit (INDEPENDENT_AMBULATORY_CARE_PROVIDER_SITE_OTHER): Payer: BC Managed Care – PPO | Admitting: Dermatology

## 2020-09-27 DIAGNOSIS — L209 Atopic dermatitis, unspecified: Secondary | ICD-10-CM

## 2020-09-27 DIAGNOSIS — I872 Venous insufficiency (chronic) (peripheral): Secondary | ICD-10-CM

## 2020-09-27 DIAGNOSIS — L219 Seborrheic dermatitis, unspecified: Secondary | ICD-10-CM | POA: Diagnosis not present

## 2020-09-27 DIAGNOSIS — L720 Epidermal cyst: Secondary | ICD-10-CM

## 2020-09-27 DIAGNOSIS — L820 Inflamed seborrheic keratosis: Secondary | ICD-10-CM | POA: Diagnosis not present

## 2020-09-27 DIAGNOSIS — L817 Pigmented purpuric dermatosis: Secondary | ICD-10-CM | POA: Diagnosis not present

## 2020-09-27 DIAGNOSIS — L578 Other skin changes due to chronic exposure to nonionizing radiation: Secondary | ICD-10-CM

## 2020-09-27 DIAGNOSIS — Z85828 Personal history of other malignant neoplasm of skin: Secondary | ICD-10-CM

## 2020-09-27 DIAGNOSIS — L821 Other seborrheic keratosis: Secondary | ICD-10-CM

## 2020-09-27 NOTE — Progress Notes (Signed)
Follow-Up Visit   Subjective  Scott Gillespie. is a 49 y.o. male who presents for the following: Stasis dermatitis.  He also several irritating areas on his trunk.  He is improved with his seborrheic dermatitis with hydrocortisone and ketoconazole.  He does have some itching on his legs and dryness.  He had Mohs surgery for biopsy-proven basal cell carcinoma of the right lateral lower eyelid.  The following portions of the chart were reviewed this encounter and updated as appropriate:  Tobacco  Allergies  Meds  Problems  Med Hx  Surg Hx  Fam Hx     Review of Systems:  No other skin or systemic complaints except as noted in HPI or Assessment and Plan.  Objective  Well appearing patient in no apparent distress; mood and affect are within normal limits.  A focused examination was performed including the face and extremities. Relevant physical exam findings are noted in the Assessment and Plan.  Objective  Face: Pink patches with greasy scale.   Objective  B/L leg: Xerosis   Objective  R lower eyelid: Smooth white papule(s).   Objective  L flank x 1, R lat nose infraorbital, L sideburn x 2 (4): Erythematous keratotic or waxy stuck-on papule or plaque.   Assessment & Plan  Seborrheic dermatitis Face improving Continue alternating HC 2.5% cream and Ketoconazole 2% cream QOD.  Other Related Medications ketoconazole (NIZORAL) 2 % cream hydrocortisone 2.5 % cream  Atopic dermatitis, unspecified type B/L leg Continue mild soaps like Dove.  Start CeraVe cream to trunk, extremities after bath/shower daily.  Consider Eucrisa in the future.  Stasis dermatitis of both legs Left Lower Leg - Anterior With schamberg's purpura -  Graduated compression stockings and elevation Benign appearing, observe.   Milium R lower eyelid Benign appearing, observe.   Inflamed seborrheic keratosis (4) L flank x 1, R lat nose infraorbital, L sideburn x 2 Destruction of lesion -  L flank x 1, R lat nose infraorbital, L sideburn x 2 Complexity: simple   Destruction method: cryotherapy   Informed consent: discussed and consent obtained   Timeout:  patient name, date of birth, surgical site, and procedure verified Lesion destroyed using liquid nitrogen: Yes   Region frozen until ice ball extended beyond lesion: Yes   Outcome: patient tolerated procedure well with no complications   Post-procedure details: wound care instructions given    History of Basal Cell Carcinoma of the Skin -right lateral lower eyelid status post Mohs surgery - No evidence of recurrence today -follow-up with Dr. Onalee Hua and the oculoplastic surgeon - Recommend regular full body skin exams - Recommend daily broad spectrum sunscreen SPF 30+ to sun-exposed areas, reapply every 2 hours as needed.  - Call if any new or changing lesions are noted between office visits - Sample given of Serica, use daily to help reduce the appearance of scarring.   Seborrheic Keratoses - Stuck-on, waxy, tan-brown papules and plaques  - Discussed benign etiology and prognosis. - Observe - Call for any changes  Actinic Damage - diffuse scaly erythematous macules with underlying dyspigmentation - Recommend daily broad spectrum sunscreen SPF 30+ to sun-exposed areas, reapply every 2 hours as needed.  - Call for new or changing lesions.  Return in about 3 months (around 12/28/2020) for ISK follow up, recheck seb derm, recheck BCC, recheck milium .  Luther Redo, CMA, am acting as scribe for Sarina Ser, MD .  Documentation: I have reviewed the above documentation for accuracy  and completeness, and I agree with the above.  Sarina Ser, MD

## 2020-09-28 ENCOUNTER — Encounter: Payer: Self-pay | Admitting: Dermatology

## 2020-12-27 ENCOUNTER — Other Ambulatory Visit: Payer: Self-pay

## 2020-12-27 ENCOUNTER — Ambulatory Visit (INDEPENDENT_AMBULATORY_CARE_PROVIDER_SITE_OTHER): Payer: BC Managed Care – PPO | Admitting: Dermatology

## 2020-12-27 DIAGNOSIS — D229 Melanocytic nevi, unspecified: Secondary | ICD-10-CM

## 2020-12-27 DIAGNOSIS — D225 Melanocytic nevi of trunk: Secondary | ICD-10-CM

## 2020-12-27 DIAGNOSIS — Z85828 Personal history of other malignant neoplasm of skin: Secondary | ICD-10-CM | POA: Diagnosis not present

## 2020-12-27 DIAGNOSIS — B353 Tinea pedis: Secondary | ICD-10-CM

## 2020-12-27 DIAGNOSIS — Z1283 Encounter for screening for malignant neoplasm of skin: Secondary | ICD-10-CM

## 2020-12-27 DIAGNOSIS — L814 Other melanin hyperpigmentation: Secondary | ICD-10-CM

## 2020-12-27 DIAGNOSIS — L578 Other skin changes due to chronic exposure to nonionizing radiation: Secondary | ICD-10-CM

## 2020-12-27 DIAGNOSIS — D2262 Melanocytic nevi of left upper limb, including shoulder: Secondary | ICD-10-CM | POA: Diagnosis not present

## 2020-12-27 DIAGNOSIS — D1800 Hemangioma unspecified site: Secondary | ICD-10-CM

## 2020-12-27 DIAGNOSIS — L409 Psoriasis, unspecified: Secondary | ICD-10-CM

## 2020-12-27 DIAGNOSIS — L821 Other seborrheic keratosis: Secondary | ICD-10-CM

## 2020-12-27 DIAGNOSIS — L219 Seborrheic dermatitis, unspecified: Secondary | ICD-10-CM

## 2020-12-27 MED ORDER — KETOCONAZOLE 2 % EX CREA: 1.0000 "application " | TOPICAL_CREAM | Freq: Every day | CUTANEOUS | 6 refills | Status: AC

## 2020-12-27 NOTE — Progress Notes (Signed)
Follow-Up Visit   Subjective  Scott Gillespie. is a 50 y.o. male who presents for the following: Follow-up (ISK follow up of left flank, right lat infraorbital, left sideburn treated with LN2./Seb Derm follow up of face treating with HC 2.5% and Ketoconazole 2% cream.) and Basal Cell Carcinoma (History of BCC of right lat lower eyelid treated with [facility] 07/2020.). The patient presents for Total-Body Skin Exam (TBSE) for skin cancer screening and mole check.  The following portions of the chart were reviewed this encounter and updated as appropriate:   Tobacco  Allergies  Meds  Problems  Med Hx  Surg Hx  Fam Hx     Review of Systems:  No other skin or systemic complaints except as noted in HPI or Assessment and Plan.  Objective  Well appearing patient in no apparent distress; mood and affect are within normal limits.  A full examination was performed including scalp, head, eyes, ears, nose, lips, neck, chest, axillae, abdomen, back, buttocks, bilateral upper extremities, bilateral lower extremities, hands, feet, fingers, toes, fingernails, and toenails. All findings within normal limits unless otherwise noted below.  Objective  Right Lat Lower Eyelid: Well healed excision site  Objective  Left ant shoulder: Brown macule with hypertrichosis  Objective  Bilateral feet: Scaliness of feet and toenail dystrophy.  Objective  Bilateral knees, bilateral elbows: Hyperkeratotic patches   Objective  Face: Pinkness and scale   Assessment & Plan    Lentigines - Scattered tan macules - Discussed due to sun exposure - Benign, observe - Call for any changes  Seborrheic Keratoses - Stuck-on, waxy, tan-brown papules and plaques  - Discussed benign etiology and prognosis. - Observe - Call for any changes  Melanocytic Nevi - Tan-brown and/or pink-flesh-colored symmetric macules and papules - Benign appearing on exam today - Observation - Call clinic for new or changing  moles - Recommend daily use of broad spectrum spf 30+ sunscreen to sun-exposed areas.   Hemangiomas - Red papules - Discussed benign nature - Observe - Call for any changes  Actinic Damage - Chronic, secondary to cumulative UV/sun exposure - diffuse scaly erythematous macules with underlying dyspigmentation - Recommend daily broad spectrum sunscreen SPF 30+ to sun-exposed areas, reapply every 2 hours as needed.  - Call for new or changing lesions.  Skin cancer screening performed today.  History of basal cell carcinoma (BCC) Right Lat Lower Eyelid Clear. Observe for recurrence. Call clinic for new or changing lesions.  Recommend regular skin exams, daily broad-spectrum spf 30+ sunscreen use, and photoprotection.    Becker's nevus Left  shoulder Benign appearing. Observe.  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 It is associated with an increased risk of inflammatory bowel disease, heart disease, non-alcoholic fatty liver disease, and depression.    Recommend AmLactin Rapid Relief qd-bid - samples given Pt declines Rx   Seborrheic dermatitis Face Seborrheic Dermatitis  -  is a chronic persistent rash characterized by pinkness and scaling most commonly of the mid face but also can occur on the scalp (dandruff), ears; mid chest and mid back. It tends to be exacerbated by stress and cooler weather.  People who have neurologic disease may experience new onset or exacerbation of existing  seborrheic dermatitis.  The condition is not curable but treatable and can be controlled.  Continue Ketoconazole 2% cream 3 times per week, HC 2.5% cream 3 times  per week  Other Related Medications ketoconazole (NIZORAL) 2 % cream hydrocortisone 2.5 % cream  Return in about 6 months (around 06/26/2021) for Tinea and Psoriasis follow up.   I, Ashok Cordia, CMA, am acting as scribe for Sarina Ser, MD .  Documentation: I have reviewed the above documentation for accuracy and completeness, and I agree with the above.  Sarina Ser, MD

## 2020-12-27 NOTE — Patient Instructions (Signed)
Recommend AmLactin Rapid Relief to elbows and knees

## 2020-12-28 ENCOUNTER — Encounter: Payer: Self-pay | Admitting: Dermatology

## 2021-05-01 ENCOUNTER — Ambulatory Visit
Admission: EM | Admit: 2021-05-01 | Discharge: 2021-05-01 | Disposition: A | Discharge status: Home / Self Care | Payer: BC Managed Care – PPO | Attending: Sports Medicine | Admitting: Sports Medicine

## 2021-05-01 ENCOUNTER — Encounter: Payer: Self-pay | Admitting: Emergency Medicine

## 2021-05-01 ENCOUNTER — Other Ambulatory Visit: Payer: Self-pay

## 2021-05-01 DIAGNOSIS — R509 Fever, unspecified: Secondary | ICD-10-CM | POA: Insufficient documentation

## 2021-05-01 DIAGNOSIS — R0981 Nasal congestion: Secondary | ICD-10-CM | POA: Insufficient documentation

## 2021-05-01 DIAGNOSIS — M791 Myalgia, unspecified site: Secondary | ICD-10-CM | POA: Insufficient documentation

## 2021-05-01 DIAGNOSIS — B349 Viral infection, unspecified: Secondary | ICD-10-CM | POA: Diagnosis present

## 2021-05-01 DIAGNOSIS — R059 Cough, unspecified: Secondary | ICD-10-CM | POA: Diagnosis present

## 2021-05-01 DIAGNOSIS — R519 Headache, unspecified: Secondary | ICD-10-CM | POA: Diagnosis present

## 2021-05-01 DIAGNOSIS — U071 COVID-19: Secondary | ICD-10-CM | POA: Diagnosis present

## 2021-05-01 LAB — RESP PANEL BY RT-PCR (FLU A&B, COVID) ARPGX2
Influenza A by PCR: NEGATIVE
Influenza B by PCR: NEGATIVE
SARS Coronavirus 2 by RT PCR: POSITIVE — AB

## 2021-05-01 MED ORDER — PROMETHAZINE-DM 6.25-15 MG/5ML PO SYRP: 5.0000 mL | ORAL_SOLUTION | Freq: Four times a day (QID) | ORAL | 0 refills | Status: DC | PRN

## 2021-05-01 NOTE — ED Triage Notes (Signed)
Patient c/o cough, congestion and fever that started on Friday.

## 2021-05-01 NOTE — Discharge Instructions (Addendum)
You tested positive for COVID.  Negative for influenza. Please see educational handouts. I prescribed a cough medicine. Plenty of rest, plenty fluids, Tylenol or Motrin for any fever or discomfort. If your symptoms worsen please go to the ER.

## 2021-05-01 NOTE — ED Provider Notes (Signed)
[facility]    CSN: [id number] Arrival date & time: 05/01/21  0901      History   Chief Complaint Chief Complaint  Patient presents with  . Cough  . Fever    HPI Scott Gillespie. is a 50 y.o. male.   Patient pleasant 50 year old male who presents for evaluation of the above issue.  His PCP is Dr. Rosario Jacks.  He works for [employer].  He reports 2 to 3 days of fever and cough with fatigue.  He says he has been in bed for the last few days.  He has been rotating Tylenol and ibuprofen and taking some Sudafed.  He also has nasal congestion sore throat and headache.  He denies history of asthma.  He says patient has not been wheezing.  No chest pain or shortness of breath.  No abdominal or urinary symptoms.  He has been vaccinated against COVID x2 and has received a booster.  He is also had his flu shot.  No COVID history or flu history.  He has had flu exposure his daughter tested positive for influenza A several days ago and he has also had COVID exposure as his boss left work and was diagnosed with COVID several days ago.  Has not taken any fever reducing medicines in the last 7 to 8 hours.  No red flag signs or symptoms elicited on history.     Past Medical History:  Diagnosis Date  . Basal cell carcinoma 04/14/2020   Right lat. lower eyelid. Infiltrative pattern.  . Hyperlipemia 05/09/2011  . Thyroid disease 05/05/2011    Patient Active Problem List   Diagnosis Date Noted  . H/O small bowel obstruction 12/14/2016    Past Surgical History:  Procedure Laterality Date  . APPENDECTOMY Right 05/1984  . LAPAROTOMY N/A 12/03/2016   Procedure: EXPLORATORY LAPAROTOMY;  Surgeon: Florene Glen, MD;  Location: [facility] ORS;  Service: General;  Laterality: N/A;       Home Medications    Prior to Admission medications   Medication Sig Start Date End Date Taking? Authorizing Provider  cetirizine (ZYRTEC) 10 MG tablet Take 10 mg by mouth daily.   Yes [provider]  esomeprazole (NEXIUM) 40 MG capsule Take 40 mg by mouth daily at 12 noon.   Yes [provider]  levothyroxine (SYNTHROID, LEVOTHROID) 150 MCG tablet Take 150 mcg by mouth daily before breakfast.   Yes [provider]  promethazine-dextromethorphan (PROMETHAZINE-DM) 6.25-15 MG/5ML syrup Take 5 mLs by mouth 4 (four) times daily as needed for cough. 05/01/21  Yes Verda Cumins, MD  Rosuvastatin Calcium (CRESTOR PO) Take 10 mg by mouth daily.    Yes [provider]  econazole nitrate 1 % cream Apply topically. 10/12/14   [provider]  fluticasone (FLONASE) 50 MCG/ACT nasal spray Place 1 spray into both nostrils 2 (two) times daily. 03/25/16   Betancourt, Aura Fey, NP  hydrocortisone 2.5 % cream Apply to scaly areas on the face QD on Tuesday, Thursday, and Saturday. 08/16/20   Ralene Bathe, MD  ibuprofen (ADVIL,MOTRIN) 600 MG tablet Take 1 tablet (600 mg total) by mouth every 8 (eight) hours as needed for fever or mild pain. 12/07/16   Olean Ree, MD  ketoconazole (NIZORAL) 2 % cream Apply to scaly areas on the face QD on Monday, Wednesday, and Friday 08/16/20   Ralene Bathe, MD  oxyCODONE (OXY IR/ROXICODONE) 5 MG immediate release tablet Take 1-2 tablets (5-10 mg total) by mouth  every 4 (four) hours as needed for moderate pain or severe pain. 12/07/16   Piscoya, Jacqulyn Bath, MD  sodium chloride (OCEAN) 0.65 % SOLN nasal spray Place 2 sprays into both nostrils every 2 (two) hours while awake. 03/25/16 12/02/17  Betancourt, Aura Fey, NP    Family History Family History  Family history unknown: Yes    Social History Social History   Tobacco Use  . Smoking status: Former Smoker    Packs/day: 2.00    Years: 18.00    Pack years: 36.00    Types: Cigarettes    Start date: 09/02/1990    Quit date: 12/02/2006    Years since quitting: 14.4  . Smokeless tobacco: Never Used  Vaping Use  . Vaping Use: Never used  Substance Use Topics  . Alcohol use: No  . Drug  use: No     Allergies   Patient has no known allergies.   Review of Systems Review of Systems  Constitutional: Positive for activity change, chills, fatigue and fever. Negative for appetite change and diaphoresis.  HENT: Positive for congestion and sore throat. Negative for ear pain, postnasal drip, rhinorrhea, sinus pressure, sinus pain and sneezing.   Eyes: Negative for pain.  Respiratory: Positive for cough. Negative for chest tightness, shortness of breath and wheezing.   Cardiovascular: Negative for chest pain and palpitations.  Gastrointestinal: Negative for abdominal pain, diarrhea, nausea and vomiting.  Genitourinary: Negative for dysuria.  Musculoskeletal: Negative for back pain, myalgias and neck pain.  Skin: Negative for color change, pallor, rash and wound.  Neurological: Positive for headaches. Negative for dizziness, light-headedness and numbness.  All other systems reviewed and are negative.    Physical Exam Triage Vital Signs ED Triage Vitals  Enc Vitals Group     BP 05/01/21 0913 (!) 147/97     Pulse Rate 05/01/21 0913 73     Resp 05/01/21 0913 16     Temp 05/01/21 0913 98.3 F (36.8 C)     Temp Source 05/01/21 0913 Oral     SpO2 05/01/21 0913 99 %     Weight 05/01/21 0911 206 lb (93.4 kg)     Height 05/01/21 0911 5\' 10"  (1.778 m)     Head Circumference --      Peak Flow --      Pain Score 05/01/21 0911 0     Pain Loc --      Pain Edu? --      Excl. in [place]? --    No data found.  Updated Vital Signs BP (!) 147/97 (BP Location: Left Arm)   Pulse 73   Temp 98.3 F (36.8 C) (Oral)   Resp 16   Ht 5\' 10"  (1.778 m)   Wt 93.4 kg   SpO2 99%   BMI 29.56 kg/m   Visual Acuity Right Eye Distance:   Left Eye Distance:   Bilateral Distance:    Right Eye Near:   Left Eye Near:    Bilateral Near:     Physical Exam Vitals and nursing note reviewed.  Constitutional:      General: He is not in acute distress.    Appearance: Normal appearance. He is  not ill-appearing, toxic-appearing or diaphoretic.  HENT:     Head: Normocephalic and atraumatic.     Right Ear: Tympanic membrane normal.     Nose: Congestion present. No rhinorrhea.     Mouth/Throat:     Mouth: Mucous membranes are moist.     Pharynx: Posterior oropharyngeal  erythema present. No oropharyngeal exudate.  Eyes:     General: No scleral icterus.       Right eye: No discharge.        Left eye: No discharge.     Conjunctiva/sclera: Conjunctivae normal.     Pupils: Pupils are equal, round, and reactive to light.  Cardiovascular:     Rate and Rhythm: Normal rate and regular rhythm.     Pulses: Normal pulses.     Heart sounds: Normal heart sounds. No murmur heard. No friction rub. No gallop.   Pulmonary:     Effort: Pulmonary effort is normal. No respiratory distress.     Breath sounds: Normal breath sounds. No stridor. No wheezing, rhonchi or rales.  Musculoskeletal:     Cervical back: Normal range of motion and neck supple.  Skin:    General: Skin is warm and dry.     Capillary Refill: Capillary refill takes less than 2 seconds.     Findings: No bruising, erythema, lesion or rash.  Neurological:     General: No focal deficit present.     Mental Status: He is alert and oriented to person, place, and time.  Psychiatric:        Mood and Affect: Mood normal.      UC Treatments / Results  Labs (all labs ordered are listed, but only abnormal results are displayed) Labs Reviewed  RESP PANEL BY RT-PCR (FLU A&B, COVID) ARPGX2 - Abnormal; Notable for the following components:      Result Value   SARS Coronavirus 2 by RT PCR POSITIVE (*)    All other components within normal limits    EKG   Radiology No results found.  Procedures Procedures (including critical care time)  Medications Ordered in UC Medications - No data to display  Initial Impression / Assessment and Plan / UC Course  I have reviewed the triage vital signs and the nursing notes.  Pertinent  labs & imaging results that were available during my care of the patient were reviewed by me and considered in my medical decision making (see chart for details).   Clinical impression: URI symptoms, flulike illness for 2 to 3 days.  Include fever, cough, fatigue, nasal congestion, sore throat, and headache.  He has had COVID and influenza exposure.  He is fully vaccinated.  Treatment plan: 1.  The findings and treatment plan were discussed in detail with the patient.  Patient was in agreement. 2.  We will obtain a respiratory panel.  It was positive for COVID.  Negative for influenza. 3.  Educational handouts were provided. 4.  Plenty of rest, plenty fluids, Tylenol or Motrin for any fever or discomfort. 5.  We will send in a prescription for cough medicine. 6.  If symptoms persist he should see his primary care physician. 7.  If symptoms worsen that he should go to the ER. 8.  Provided a work note keep him out of work for least 5 days per the current CDC guidelines. 9.  He was discharged from care in stable condition and he will follow-up here as needed.    Final Clinical Impressions(s) / UC Diagnoses   Final diagnoses:  COVID-19  Febrile illness, acute  Myalgia  Headache due to viral infection  Cough  Nasal congestion     Discharge Instructions     You tested positive for COVID.  Negative for influenza. Please see educational handouts. I prescribed a cough medicine. Plenty of rest, plenty fluids, Tylenol or  Motrin for any fever or discomfort. If your symptoms worsen please go to the ER.     ED Prescriptions    Medication Sig Dispense Auth. Provider   promethazine-dextromethorphan (PROMETHAZINE-DM) 6.25-15 MG/5ML syrup Take 5 mLs by mouth 4 (four) times daily as needed for cough. 180 mL Verda Cumins, MD     PDMP not reviewed this encounter.   Verda Cumins, MD 05/01/21 1007

## 2021-05-02 ENCOUNTER — Telehealth: Payer: Self-pay | Admitting: Nurse Practitioner

## 2021-05-02 NOTE — Telephone Encounter (Signed)
Called to discuss with patient about COVID-19 symptoms and the use of one of the available treatments for those with mild to moderate Covid symptoms and at a high risk of hospitalization.  Pt appears to qualify for outpatient treatment due to co-morbid conditions and/or a member of an at-risk group in accordance with the FDA Emergency Use Authorization.    Symptom onset: 04/29/21 Vaccinated: Yes Booster? Yes Immunocompromised? Yes Qualifiers: hld, thyroid disease, basal cell carcinoma NIH Criteria: 1  Unable to reach pt - Voicemail left. No recent labwork on file.   Alda Lea, NP [place] [facility] [phone]

## 2021-05-09 ENCOUNTER — Ambulatory Visit
Admission: EM | Admit: 2021-05-09 | Discharge: 2021-05-09 | Disposition: A | Discharge status: Home / Self Care | Payer: BC Managed Care – PPO | Attending: Physician Assistant | Admitting: Physician Assistant

## 2021-05-09 ENCOUNTER — Ambulatory Visit (INDEPENDENT_AMBULATORY_CARE_PROVIDER_SITE_OTHER): Payer: BC Managed Care – PPO

## 2021-05-09 ENCOUNTER — Emergency Department
Admission: EM | Admit: 2021-05-09 | Discharge: 2021-05-09 | Disposition: A | Discharge status: Home / Self Care | Payer: BC Managed Care – PPO | Attending: Emergency Medicine | Admitting: Emergency Medicine

## 2021-05-09 ENCOUNTER — Encounter: Payer: Self-pay | Admitting: Emergency Medicine

## 2021-05-09 ENCOUNTER — Other Ambulatory Visit: Payer: Self-pay

## 2021-05-09 DIAGNOSIS — R059 Cough, unspecified: Secondary | ICD-10-CM | POA: Diagnosis not present

## 2021-05-09 DIAGNOSIS — R0789 Other chest pain: Secondary | ICD-10-CM | POA: Diagnosis present

## 2021-05-09 DIAGNOSIS — Z85828 Personal history of other malignant neoplasm of skin: Secondary | ICD-10-CM | POA: Diagnosis not present

## 2021-05-09 DIAGNOSIS — U071 COVID-19: Secondary | ICD-10-CM | POA: Diagnosis not present

## 2021-05-09 DIAGNOSIS — Z87891 Personal history of nicotine dependence: Secondary | ICD-10-CM | POA: Diagnosis not present

## 2021-05-09 DIAGNOSIS — R001 Bradycardia, unspecified: Secondary | ICD-10-CM

## 2021-05-09 DIAGNOSIS — R42 Dizziness and giddiness: Secondary | ICD-10-CM | POA: Diagnosis not present

## 2021-05-09 LAB — COMPREHENSIVE METABOLIC PANEL
ALT: 44 U/L (ref 0–44)
AST: 27 U/L (ref 15–41)
Albumin: 4.4 g/dL (ref 3.5–5.0)
Alkaline Phosphatase: 55 U/L (ref 38–126)
Anion gap: 7 (ref 5–15)
BUN: 12 mg/dL (ref 6–20)
CO2: 26 mmol/L (ref 22–32)
Calcium: 9.2 mg/dL (ref 8.9–10.3)
Chloride: 102 mmol/L (ref 98–111)
Creatinine, Ser: 0.92 mg/dL (ref 0.61–1.24)
GFR, Estimated: >60 mL/min (ref >60)
Glucose, Bld: 96 mg/dL (ref 70–99)
Potassium: 4.2 mmol/L (ref 3.5–5.1)
Sodium: 135 mmol/L (ref 135–145)
Total Bilirubin: 1.7 mg/dL — ABNORMAL HIGH (ref 0.3–1.2)
Total Protein: 7.3 g/dL (ref 6.5–8.1)

## 2021-05-09 LAB — CBC
HCT: 41.2 % (ref 39.0–52.0)
Hemoglobin: 14.3 g/dL (ref 13.0–17.0)
MCH: 30.7 pg (ref 26.0–34.0)
MCHC: 34.7 g/dL (ref 30.0–36.0)
MCV: 88.4 fL (ref 80.0–100.0)
Platelets: 221 10*3/uL (ref 150–400)
RBC: 4.66 MIL/uL (ref 4.22–5.81)
RDW: 11.7 % (ref 11.5–15.5)
WBC: 5.5 10*3/uL (ref 4.0–10.5)
nRBC: 0 % (ref 0.0–0.2)

## 2021-05-09 LAB — TROPONIN I (HIGH SENSITIVITY): Troponin I (High Sensitivity): 10 ng/L (ref <18)

## 2021-05-09 MED ORDER — LIDOCAINE VISCOUS HCL 2 % MT SOLN: 15.0000 mL | Freq: Four times a day (QID) | OROMUCOSAL | 0 refills | Status: AC | PRN

## 2021-05-09 NOTE — ED Triage Notes (Addendum)
Pt in with co chest pain, dizziness, cough and feels like something in throat since last week. States started symptoms on 5/27 and was dx with covid 5/29. Went to urgent care and was sent here due to abnormal ekg. Pt denies any cardiac history. PT had cxr done at urgent care with results available in chl.

## 2021-05-09 NOTE — ED Provider Notes (Signed)
[facility] Emergency Department Provider Note   ____________________________________________    I have reviewed the triage vital signs and the nursing notes.   HISTORY  Chief Complaint Sore throat, fatigue    HPI Scott Gillespie. is a 50 y.o. male who presents with complaints of fatigue, sore throat, chest discomfort in the setting of COVID-19 infection.  Patient denies pleurisy.  No shortness of breath.  Occasional cough.  Sent from urgent care for further evaluation.  Patient denies chest pain at this time.  He reports his primary complaint is sore throat and a sense of something irritating his throat.  Patient is vaccinated and boosted  Past Medical History:  Diagnosis Date  . Basal cell carcinoma 04/14/2020   Right lat. lower eyelid. Infiltrative pattern.  . Hyperlipemia 05/09/2011  . Thyroid disease 05/05/2011    Patient Active Problem List   Diagnosis Date Noted  . H/O small bowel obstruction 12/14/2016    Past Surgical History:  Procedure Laterality Date  . APPENDECTOMY Right 05/1984  . LAPAROTOMY N/A 12/03/2016   Procedure: EXPLORATORY LAPAROTOMY;  Surgeon: Florene Glen, MD;  Location: [facility] ORS;  Service: General;  Laterality: N/A;    Prior to Admission medications   Medication Sig Start Date End Date Taking? Authorizing Provider  lidocaine (XYLOCAINE) 2 % solution Use as directed 15 mLs in the mouth or throat every 6 (six) hours as needed for up to 3 days for mouth pain. 05/09/21 05/12/21 Yes Lavonia Drafts, MD  cetirizine (ZYRTEC) 10 MG tablet Take 10 mg by mouth daily.    [provider]  econazole nitrate 1 % cream Apply topically. 10/12/14   [provider]  esomeprazole (NEXIUM) 40 MG capsule Take 40 mg by mouth daily at 12 noon.    [provider]  fluticasone (FLONASE) 50 MCG/ACT nasal spray Place 1 spray into both nostrils 2 (two) times daily. 03/25/16   Betancourt, Aura Fey, NP  hydrocortisone 2.5  % cream Apply to scaly areas on the face QD on Tuesday, Thursday, and Saturday. 08/16/20   Ralene Bathe, MD  ibuprofen (ADVIL,MOTRIN) 600 MG tablet Take 1 tablet (600 mg total) by mouth every 8 (eight) hours as needed for fever or mild pain. 12/07/16   Olean Ree, MD  ketoconazole (NIZORAL) 2 % cream Apply to scaly areas on the face QD on Monday, Wednesday, and Friday 08/16/20   Ralene Bathe, MD  levothyroxine (SYNTHROID, LEVOTHROID) 150 MCG tablet Take 150 mcg by mouth daily before breakfast.    [provider]  Rosuvastatin Calcium (CRESTOR PO) Take 10 mg by mouth daily.     [provider]  sodium chloride (OCEAN) 0.65 % SOLN nasal spray Place 2 sprays into both nostrils every 2 (two) hours while awake. 03/25/16 12/02/17  BetancourtAura Fey, NP     Allergies Patient has no known allergies.  Family History  Family history unknown: Yes    Social History Social History   Tobacco Use  . Smoking status: Former Smoker    Packs/day: 2.00    Years: 18.00    Pack years: 36.00    Types: Cigarettes    Start date: 09/02/1990    Quit date: 12/02/2006    Years since quitting: 14.4  . Smokeless tobacco: Never Used  Vaping Use  . Vaping Use: Never used  Substance Use Topics  . Alcohol use: No  . Drug use: No    Review of Systems  Constitutional: Fatigue  Eyes: No visual changes.  ENT: As above Cardiovascular: As above Respiratory: Denies shortness of breath. Gastrointestinal: No abdominal pain.  No nausea, no vomiting.   Genitourinary: Negative for dysuria. Musculoskeletal: Myalgias. Skin: Negative for rash. Neurological: Negative for headaches or weakness   ____________________________________________   PHYSICAL EXAM:  VITAL SIGNS: ED Triage Vitals  Enc Vitals Group     BP 05/09/21 1909 (!) 139/96     Pulse Rate 05/09/21 1909 (!) 59     Resp 05/09/21 1909 20     Temp 05/09/21 1909 98.4 F (36.9 C)     Temp Source 05/09/21 1909 Oral     SpO2  05/09/21 1909 100 %     Weight 05/09/21 1908 93.4 kg (206 lb)     Height 05/09/21 1908 1.778 m (5\' 10" )     Head Circumference --      Peak Flow --      Pain Score 05/09/21 1907 4     Pain Loc --      Pain Edu? --      Excl. in [place]? --     Constitutional: Alert and oriented. No acute distress. Pleasant and interactive  Nose: No congestion/rhinnorhea. Mouth/Throat: Mucous membranes are moist.  Pharynx mildly erythematous, uvula normal, no significant swelling.  Cardiovascular: Normal rate, regular rhythm.  Good peripheral circulation. Respiratory: Normal respiratory effort.  No retractions. Lungs CTAB. Gastrointestinal: Soft and nontender. No distention.  No CVA tenderness.  Musculoskeletal:.  Warm and well perfused Neurologic:  Normal speech and language. No gross focal neurologic deficits are appreciated.  Skin:  Skin is warm, dry and intact. No rash noted. Psychiatric: Mood and affect are normal. Speech and behavior are normal.  ____________________________________________   LABS (all labs ordered are listed, but only abnormal results are displayed)  Labs Reviewed  COMPREHENSIVE METABOLIC PANEL - Abnormal; Notable for the following components:      Result Value   Total Bilirubin 1.7 (*)    All other components within normal limits  CBC  TROPONIN I (HIGH SENSITIVITY)  TROPONIN I (HIGH SENSITIVITY)   ____________________________________________  EKG  None ____________________________________________  RADIOLOGY  Chest x-ray reviewed by me, no acute abnormality ____________________________________________   PROCEDURES  Procedure(s) performed: No  Procedures   Critical Care performed: No ____________________________________________   INITIAL IMPRESSION / ASSESSMENT AND PLAN / ED COURSE  Pertinent labs & imaging results that were available during my care of the patient were reviewed by me and considered in my medical decision making (see chart for  details).  Patient overall well-appearing and in no acute distress, EKG is unremarkable, high sensitive troponin is normal  Chest x-ray unremarkable  I suspect patient's symptoms are related to COVID-19, will provide symptomatic treatment for sore throat    ____________________________________________   FINAL CLINICAL IMPRESSION(S) / ED DIAGNOSES  Final diagnoses:  COVID-19        Note:  This document was prepared using Dragon voice recognition software and may include unintentional dictation errors.   Lavonia Drafts, MD 05/09/21 2052

## 2021-05-09 NOTE — ED Notes (Signed)
Patient is being discharged from the Urgent Care and sent to the Emergency Department via POV . Per Shelby Mattocks, patient is in need of higher level of care due to EKG changes. Patient is aware and verbalizes understanding of plan of care.  Vitals:   05/09/21 1758  BP: (!) 131/93  Pulse: 67  Resp: 18  Temp: 98.4 F (36.9 C)  SpO2: 100%

## 2021-05-09 NOTE — ED Triage Notes (Signed)
Patient was diagnosed with COVID on 05/29. He is c/o cough and dizziness that started when his symptoms started last week.

## 2021-05-09 NOTE — Discharge Instructions (Addendum)
Your EKG today is abnormal, there are changes compared to your last 2 EKG's from the past. I recommend you go to the ER to have more test done that we cant do here today.

## 2021-05-09 NOTE — ED Provider Notes (Signed)
[facility]    CSN: [id number] Arrival date & time: 05/09/21  1727      History   Chief Complaint Chief Complaint  Patient presents with  . Cough  . Dizziness  . Fatigue    HPI Scott Gillespie. is a 50 y.o. male who is here due to having cough and dizziness since diagnosed with covid 5/29.  Has never had covid infections, and has had 3 covid injections.  Feels there is something stuck in his throat and is still still coughing and on occasion brings mucous up. Has not noticed wheezing.. his chest is sore and is not provoked with cough or SOB. Denies leg swelling. Only had a fever 5/27 & 28. He is here concerned he may have pneumonia   Past Medical History:  Diagnosis Date  . Basal cell carcinoma 04/14/2020   Right lat. lower eyelid. Infiltrative pattern.  . Hyperlipemia 05/09/2011  . Thyroid disease 05/05/2011    Patient Active Problem List   Diagnosis Date Noted  . H/O small bowel obstruction 12/14/2016    Past Surgical History:  Procedure Laterality Date  . APPENDECTOMY Right 05/1984  . LAPAROTOMY N/A 12/03/2016   Procedure: EXPLORATORY LAPAROTOMY;  Surgeon: Florene Glen, MD;  Location: [facility] ORS;  Service: General;  Laterality: N/A;       Home Medications    Prior to Admission medications   Medication Sig Start Date End Date Taking? Authorizing Provider  cetirizine (ZYRTEC) 10 MG tablet Take 10 mg by mouth daily.   Yes [provider]  econazole nitrate 1 % cream Apply topically. 10/12/14  Yes [provider]  esomeprazole (NEXIUM) 40 MG capsule Take 40 mg by mouth daily at 12 noon.   Yes [provider]  fluticasone (FLONASE) 50 MCG/ACT nasal spray Place 1 spray into both nostrils 2 (two) times daily. 03/25/16  Yes Betancourt, Aura Fey, NP  hydrocortisone 2.5 % cream Apply to scaly areas on the face QD on Tuesday, Thursday, and Saturday. 08/16/20  Yes Ralene Bathe, MD  ibuprofen (ADVIL,MOTRIN) 600 MG tablet Take 1  tablet (600 mg total) by mouth every 8 (eight) hours as needed for fever or mild pain. 12/07/16  Yes Piscoya, Jacqulyn Bath, MD  ketoconazole (NIZORAL) 2 % cream Apply to scaly areas on the face QD on Monday, Wednesday, and Friday 08/16/20  Yes Ralene Bathe, MD  levothyroxine (SYNTHROID, LEVOTHROID) 150 MCG tablet Take 150 mcg by mouth daily before breakfast.   Yes [provider]  Rosuvastatin Calcium (CRESTOR PO) Take 10 mg by mouth daily.    Yes [provider]  sodium chloride (OCEAN) 0.65 % SOLN nasal spray Place 2 sprays into both nostrils every 2 (two) hours while awake. 03/25/16 12/02/17  Betancourt, Aura Fey, NP    Family History Family History  Family history unknown: Yes    Social History Social History   Tobacco Use  . Smoking status: Former Smoker    Packs/day: 2.00    Years: 18.00    Pack years: 36.00    Types: Cigarettes    Start date: 09/02/1990    Quit date: 12/02/2006    Years since quitting: 14.4  . Smokeless tobacco: Never Used  Vaping Use  . Vaping Use: Never used  Substance Use Topics  . Alcohol use: No  . Drug use: No     Allergies   Patient has no known allergies.   Review of Systems Review of Systems  Constitutional: Positive for fatigue.  Negative for activity change, appetite change, chills and fever.  HENT: Negative for congestion, ear discharge, ear pain, rhinorrhea and sore throat.   Eyes: Negative for discharge.  Respiratory: Positive for cough. Negative for chest tightness and shortness of breath.   Cardiovascular: Positive for chest pain. Negative for leg swelling.  Musculoskeletal: Negative for myalgias.  Skin: Negative for rash.  Neurological: Positive for dizziness. Negative for headaches.  Hematological: Negative for adenopathy.     Physical Exam Triage Vital Signs ED Triage Vitals  Enc Vitals Group     BP 05/09/21 1758 (!) 131/93     Pulse Rate 05/09/21 1758 67     Resp 05/09/21 1758 18     Temp 05/09/21 1758 98.4  F (36.9 C)     Temp Source 05/09/21 1758 Oral     SpO2 05/09/21 1758 100 %     Weight 05/09/21 1757 206 lb (93.4 kg)     Height 05/09/21 1757 5\' 10"  (1.778 m)     Head Circumference --      Peak Flow --      Pain Score 05/09/21 1757 0     Pain Loc --      Pain Edu? --      Excl. in [place]? --    No data found.  Updated Vital Signs BP (!) 131/93 (BP Location: Left Arm)   Pulse 67   Temp 98.4 F (36.9 C) (Oral)   Resp 18   Ht 5\' 10"  (1.778 m)   Wt 206 lb (93.4 kg)   SpO2 100%   BMI 29.56 kg/m   Visual Acuity Right Eye Distance:   Left Eye Distance:   Bilateral Distance:    Right Eye Near:   Left Eye Near:    Bilateral Near:     Physical Exam Physical Exam Vitals signs and nursing note reviewed.  Constitutional:      General: She is not in acute distress.    Appearance: Normal appearance. She is not ill-appearing, toxic-appearing or diaphoretic.  HENT:     Head: Normocephalic.     Right Ear: Tympanic membrane, ear canal and external ear normal.     Left Ear: Tympanic membrane, ear canal and external ear normal.     Nose: Nose normal.     Mouth/Throat:     Mouth: Mucous membranes are moist.  Eyes:     General: No scleral icterus.       Right eye: No discharge.        Left eye: No discharge.     Conjunctiva/sclera: Conjunctivae normal.  Neck:     Musculoskeletal: Neck supple. No neck rigidity.  Cardiovascular:     Rate and Rhythm: slow rate and regular rhythm.     Heart sounds: No murmur.  Pulmonary:     Effort: Pulmonary effort is normal.     Breath sounds: Normal breath sounds.   Musculoskeletal: Normal range of motion.  Lymphadenopathy:     Cervical: No cervical adenopathy.  Skin:    General: Skin is warm and dry.     Coloration: Skin is not jaundiced.     Findings: No rash.  Neurological:     Mental Status: She is alert and oriented to person, place, and time.     Gait: Gait normal.  Psychiatric:        Mood and Affect: Mood normal.         Behavior: Behavior normal.        Thought Content: Thought  content normal.        Judgment: Judgment normal.     UC Treatments / Results  Labs (all labs ordered are listed, but only abnormal results are displayed) Labs Reviewed - No data to display  EKG Sinus Loletha Grayer with non specific ST and T wave abnormality Abnormal EKG  Radiology DG Chest 2 View  Result Date: 05/09/2021 CLINICAL DATA:  Cough for 7 days EXAM: CHEST - 2 VIEW COMPARISON:  None. FINDINGS: The heart size and mediastinal contours are within normal limits. No focal airspace consolidation, pleural effusion, or pneumothorax. The visualized skeletal structures are unremarkable. IMPRESSION: No active cardiopulmonary disease. Electronically Signed   By: Davina Poke D.O.   On: 05/09/2021 18:29    Procedures Procedures (including critical care time)  Medications Ordered in UC Medications - No data to display  Initial Impression / Assessment and Plan / UC Course  I have reviewed the triage vital signs and the nursing notes. Pertinent  imaging results that were available during my care of the patient were reviewed by me and considered in my medical decision making (see chart for details). Has bradycardia and abnormal EKG which is new when compared with past 2 EKG's and office notes showed pulse usually is in the 70's.  I sent him to ER for further work up Final Clinical Impressions(s) / UC Diagnoses   Final diagnoses:  Bradycardia  Dizziness     Discharge Instructions     Your EKG today is abnormal, there are changes compared to your last 2 EKG's from the past. I recommend you go to the ER to have more test done that we cant do here today.      ED Prescriptions    None     PDMP not reviewed this encounter.   Shelby Mattocks, [place] 05/09/21 1840

## 2021-06-22 ENCOUNTER — Other Ambulatory Visit: Payer: Self-pay

## 2021-06-22 ENCOUNTER — Ambulatory Visit (INDEPENDENT_AMBULATORY_CARE_PROVIDER_SITE_OTHER): Payer: BC Managed Care – PPO | Admitting: Dermatology

## 2021-06-22 DIAGNOSIS — Z85828 Personal history of other malignant neoplasm of skin: Secondary | ICD-10-CM | POA: Diagnosis not present

## 2021-06-22 DIAGNOSIS — L219 Seborrheic dermatitis, unspecified: Secondary | ICD-10-CM

## 2021-06-22 DIAGNOSIS — L409 Psoriasis, unspecified: Secondary | ICD-10-CM | POA: Diagnosis not present

## 2021-06-22 DIAGNOSIS — L603 Nail dystrophy: Secondary | ICD-10-CM | POA: Diagnosis not present

## 2021-06-22 DIAGNOSIS — B351 Tinea unguium: Secondary | ICD-10-CM

## 2021-06-22 DIAGNOSIS — B353 Tinea pedis: Secondary | ICD-10-CM

## 2021-06-22 MED ORDER — KETOCONAZOLE 2 % EX CREA: TOPICAL_CREAM | CUTANEOUS | 11 refills | Status: DC

## 2021-06-22 MED ORDER — HYDROCORTISONE 2.5 % EX CREA: TOPICAL_CREAM | CUTANEOUS | 11 refills | Status: DC

## 2021-06-22 NOTE — Patient Instructions (Addendum)
Continue ketoconazole 2% cream three days weekly as needed to affected areas at face Continue HC 2.5% cream three days weekly as needed to affected areas at face  Continue AmLactin daily to areas at knees, elbows and feet  If you have any questions or concerns for your doctor, please call our main line at [phone] and press option 4 to reach your doctor's medical assistant. If no one answers, please leave a voicemail as directed and we will return your call as soon as possible. Messages left after 4 pm will be answered the following business day.   You may also send [place] a message via [place]. We typically respond to MyChart messages within 1-2 business days.  For prescription refills, please ask your pharmacy to contact our office. Our fax number is [phone].  If you have an urgent issue when the clinic is closed that cannot wait until the next business day, you can page your doctor at the number below.    Please note that while we do our best to be available for urgent issues outside of office hours, we are not available 24/7.   If you have an urgent issue and are unable to reach [place], you may choose to seek medical care at your doctor's office, retail clinic, urgent care center, or emergency room.  If you have a medical emergency, please immediately call 911 or go to the emergency department.  Pager Numbers  - Dr. Nehemiah Massed: [phone]  - Dr. Laurence Ferrari: [phone]  - Dr. Nicole Kindred: [phone]  In the event of inclement weather, please call our main line at [phone] for an update on the status of any delays or closures.  Dermatology Medication Tips: Please keep the boxes that topical medications come in in order to help keep track of the instructions about where and how to use these. Pharmacies typically print the medication instructions only on the boxes and not directly on the medication tubes.   If your medication is too expensive, please contact our office at [phone]  option 4 or send [place] a message through [place].   We are unable to tell what your co-pay for medications will be in advance as this is different depending on your insurance coverage. However, we may be able to find a substitute medication at lower cost or fill out paperwork to get insurance to cover a needed medication.   If a prior authorization is required to get your medication covered by your insurance company, please allow [place] 1-2 business days to complete this process.  Drug prices often vary depending on where the prescription is filled and some pharmacies may offer cheaper prices.  The website www.goodrx.com contains coupons for medications through different pharmacies. The prices here do not account for what the cost may be with help from insurance (it may be cheaper with your insurance), but the website can give you the price if you did not use any insurance.  - You can print the associated coupon and take it with your prescription to the pharmacy.  - You may also stop by our office during regular business hours and pick up a GoodRx coupon card.  - If you need your prescription sent electronically to a different pharmacy, notify our office through [facility] or by phone at [phone] option 4.

## 2021-06-22 NOTE — Progress Notes (Signed)
   Follow-Up Visit   Subjective  Scott Gillespie. is a 50 y.o. male who presents for the following: Follow-up (Patient here for 6 month psoriasis and tinea pedis follow up. Patient only using Amlactin at knees and elbows and has had improvement. Patient was prescribed ketoconazole cream for feet but never picked it up, advises he thinks feet may have improved. ).  The following portions of the chart were reviewed this encounter and updated as appropriate:   Tobacco  Allergies  Meds  Problems  Med Hx  Surg Hx  Fam Hx     Review of Systems:  No other skin or systemic complaints except as noted in HPI or Assessment and Plan.  Objective  Well appearing patient in no apparent distress; mood and affect are within normal limits.  A focused examination was performed including face, scalp, knees, elbows, feet. Relevant physical exam findings are noted in the Assessment and Plan.  face Clear today  bilateral knees, elbows Hyperkeratosis of the left foot with psoriatic nail changes  Left Ankle - Anterior Scaling and maceration web spaces and over distal and lateral soles.   toenails Toenail dystrophy   Assessment & Plan  Seborrheic dermatitis /SEBO psoriasis face /sebopsoriasis Seborrheic Dermatitis  -  is a chronic persistent rash characterized by pinkness and scaling most commonly of the mid face but also can occur on the scalp (dandruff), ears; mid chest and mid back. It tends to be exacerbated by stress and cooler weather.  People who have neurologic disease may experience new onset or exacerbation of existing seborrheic dermatitis.  The condition is not curable but treatable and can be controlled.  Continue ketoconazole 2% cream three days weekly as needed Continue HC 2.5% cream three days weekly  Related Medications ketoconazole (NIZORAL) 2 % cream Apply to scaly areas on the face QD on Monday, Wednesday, and Friday  hydrocortisone 2.5 % cream Apply to scaly areas  on the face QD on Tuesday, Thursday, and Saturday.  Psoriasis bilateral knees, elbows Psoriasis is a chronic non-curable, but treatable genetic/hereditary disease that may have other systemic features affecting other organ systems such as joints (Psoriatic Arthritis). It is associated with an increased risk of inflammatory bowel disease, heart disease, non-alcoholic fatty liver disease, and depression.    Continue Amlactin daily to affected areas at feet, knees and elbows  Discussed adding Otezla -patient declines at this time.  Toenail dystrophy  This may be psoriasis change in the nails versus trauma versus tinea/fungal toenails Chronic and persistent Discussed Ddx and treatment options in detail. Pt opts to observe.  We will recheck next visit.  History of Basal Cell Carcinoma of the Skin - No evidence of recurrence today - Recommend regular full body skin exams - Recommend daily broad spectrum sunscreen SPF 30+ to sun-exposed areas, reapply every 2 hours as needed.  - Call if any new or changing lesions are noted between office visits  Return in about 1 year (around 06/22/2022) for Psoriasis, TBSE.  Graciella Belton, RMA, am acting as scribe for Sarina Ser, MD . Documentation: I have reviewed the above documentation for accuracy and completeness, and I agree with the above.  Sarina Ser, MD

## 2021-06-23 ENCOUNTER — Encounter: Payer: Self-pay | Admitting: Dermatology

## 2022-06-29 ENCOUNTER — Ambulatory Visit (INDEPENDENT_AMBULATORY_CARE_PROVIDER_SITE_OTHER): Payer: BC Managed Care – PPO | Admitting: Dermatology

## 2022-06-29 ENCOUNTER — Encounter: Payer: Self-pay | Admitting: Dermatology

## 2022-06-29 DIAGNOSIS — D1800 Hemangioma unspecified site: Secondary | ICD-10-CM

## 2022-06-29 DIAGNOSIS — L409 Psoriasis, unspecified: Secondary | ICD-10-CM

## 2022-06-29 DIAGNOSIS — Z85828 Personal history of other malignant neoplasm of skin: Secondary | ICD-10-CM

## 2022-06-29 DIAGNOSIS — L821 Other seborrheic keratosis: Secondary | ICD-10-CM

## 2022-06-29 DIAGNOSIS — I872 Venous insufficiency (chronic) (peripheral): Secondary | ICD-10-CM

## 2022-06-29 DIAGNOSIS — B353 Tinea pedis: Secondary | ICD-10-CM

## 2022-06-29 DIAGNOSIS — Z1283 Encounter for screening for malignant neoplasm of skin: Secondary | ICD-10-CM

## 2022-06-29 DIAGNOSIS — L219 Seborrheic dermatitis, unspecified: Secondary | ICD-10-CM

## 2022-06-29 DIAGNOSIS — L814 Other melanin hyperpigmentation: Secondary | ICD-10-CM

## 2022-06-29 DIAGNOSIS — B351 Tinea unguium: Secondary | ICD-10-CM

## 2022-06-29 DIAGNOSIS — L918 Other hypertrophic disorders of the skin: Secondary | ICD-10-CM

## 2022-06-29 DIAGNOSIS — L578 Other skin changes due to chronic exposure to nonionizing radiation: Secondary | ICD-10-CM

## 2022-06-29 DIAGNOSIS — D229 Melanocytic nevi, unspecified: Secondary | ICD-10-CM

## 2022-06-29 MED ORDER — KETOCONAZOLE 2 % EX CREA: TOPICAL_CREAM | CUTANEOUS | 11 refills | Status: AC

## 2022-06-29 MED ORDER — KETOCONAZOLE 2 % EX CREA: TOPICAL_CREAM | CUTANEOUS | 11 refills | Status: DC

## 2022-06-29 MED ORDER — HYDROCORTISONE 2.5 % EX CREA: TOPICAL_CREAM | CUTANEOUS | 11 refills | Status: AC

## 2022-06-29 NOTE — Patient Instructions (Signed)
Due to recent changes in healthcare laws, you may see results of your pathology and/or laboratory studies on MyChart before the doctors have had a chance to review them. We understand that in some cases there may be results that are confusing or concerning to you. Please understand that not all results are received at the same time and often the doctors may need to interpret multiple results in order to provide you with the best plan of care or course of treatment. Therefore, we ask that you please give us 2 business days to thoroughly review all your results before contacting the office for clarification. Should we see a critical lab result, you will be contacted sooner.   If You Need Anything After Your Visit  If you have any questions or concerns for your doctor, please call our main line at [phone] and press option 4 to reach your doctor's medical assistant. If no one answers, please leave a voicemail as directed and we will return your call as soon as possible. Messages left after 4 pm will be answered the following business day.   You may also send us a message via MyChart. We typically respond to MyChart messages within 1-2 business days.  For prescription refills, please ask your pharmacy to contact our office. Our fax number is [phone].  If you have an urgent issue when the clinic is closed that cannot wait until the next business day, you can page your doctor at the number below.    Please note that while we do our best to be available for urgent issues outside of office hours, we are not available 24/7.   If you have an urgent issue and are unable to reach us, you may choose to seek medical care at your doctor's office, retail clinic, urgent care center, or emergency room.  If you have a medical emergency, please immediately call 911 or go to the emergency department.  Pager Numbers  - Dr. Kowalski: [phone]  - Dr. Moye: [phone]  - Dr. Stewart:  [phone]  In the event of inclement weather, please call our main line at [phone] for an update on the status of any delays or closures.  Dermatology Medication Tips: Please keep the boxes that topical medications come in in order to help keep track of the instructions about where and how to use these. Pharmacies typically print the medication instructions only on the boxes and not directly on the medication tubes.   If your medication is too expensive, please contact our office at [phone] option 4 or send us a message through MyChart.   We are unable to tell what your co-pay for medications will be in advance as this is different depending on your insurance coverage. However, we may be able to find a substitute medication at lower cost or fill out paperwork to get insurance to cover a needed medication.   If a prior authorization is required to get your medication covered by your insurance company, please allow us 1-2 business days to complete this process.  Drug prices often vary depending on where the prescription is filled and some pharmacies may offer cheaper prices.  The website www.goodrx.com contains coupons for medications through different pharmacies. The prices here do not account for what the cost may be with help from insurance (it may be cheaper with your insurance), but the website can give you the price if you did not use any insurance.  - You can print the associated coupon and take it with   your prescription to the pharmacy.  - You may also stop by our office during regular business hours and pick up a GoodRx coupon card.  - If you need your prescription sent electronically to a different pharmacy, notify our office through [facility] MyChart or by phone at [phone] option 4.     Si Usted Necesita Algo Despus de Su Visita  Tambin puede enviarnos un mensaje a travs de MyChart. Por lo general respondemos a los mensajes de MyChart en el transcurso de 1 a 2  das hbiles.  Para renovar recetas, por favor pida a su farmacia que se ponga en contacto con nuestra oficina. Nuestro nmero de fax es el [phone].  Si tiene un asunto urgente cuando la clnica est cerrada y que no puede esperar hasta el siguiente da hbil, puede llamar/localizar a su doctor(a) al nmero que aparece a continuacin.   Por favor, tenga en cuenta que aunque hacemos todo lo posible para estar disponibles para asuntos urgentes fuera del horario de oficina, no estamos disponibles las 24 horas del da, los 7 das de la semana.   Si tiene un problema urgente y no puede comunicarse con nosotros, puede optar por buscar atencin mdica  en el consultorio de su doctor(a), en una clnica privada, en un centro de atencin urgente o en una sala de emergencias.  Si tiene una emergencia mdica, por favor llame inmediatamente al 911 o vaya a la sala de emergencias.  Nmeros de bper  - Dr. Kowalski: [phone]  - Dra. Moye: [phone]  - Dra. Stewart: [phone]  En caso de inclemencias del tiempo, por favor llame a nuestra lnea principal al [phone] para una actualizacin sobre el estado de cualquier retraso o cierre.  Consejos para la medicacin en dermatologa: Por favor, guarde las cajas en las que vienen los medicamentos de uso tpico para ayudarle a seguir las instrucciones sobre dnde y cmo usarlos. Las farmacias generalmente imprimen las instrucciones del medicamento slo en las cajas y no directamente en los tubos del medicamento.   Si su medicamento es muy caro, por favor, pngase en contacto con nuestra oficina llamando al [phone] y presione la opcin 4 o envenos un mensaje a travs de MyChart.   No podemos decirle cul ser su copago por los medicamentos por adelantado ya que esto es diferente dependiendo de la cobertura de su seguro. Sin embargo, es posible que podamos encontrar un medicamento sustituto a menor costo o llenar un formulario para que el  seguro cubra el medicamento que se considera necesario.   Si se requiere una autorizacin previa para que su compaa de seguros cubra su medicamento, por favor permtanos de 1 a 2 das hbiles para completar este proceso.  Los precios de los medicamentos varan con frecuencia dependiendo del lugar de dnde se surte la receta y alguna farmacias pueden ofrecer precios ms baratos.  El sitio web www.goodrx.com tiene cupones para medicamentos de diferentes farmacias. Los precios aqu no tienen en cuenta lo que podra costar con la ayuda del seguro (puede ser ms barato con su seguro), pero el sitio web puede darle el precio si no utiliz ningn seguro.  - Puede imprimir el cupn correspondiente y llevarlo con su receta a la farmacia.  - Tambin puede pasar por nuestra oficina durante el horario de atencin regular y recoger una tarjeta de cupones de GoodRx.  - Si necesita que su receta se enve electrnicamente a una farmacia diferente, informe a nuestra oficina a travs de MyChart de [place]   o por telfono llamando al [phone] y presione la opcin 4.  

## 2022-06-29 NOTE — Progress Notes (Signed)
Follow-Up Visit   Subjective  Scott Trefz. is a 51 y.o. male who presents for the following: Annual Exam (Hx of BCC). The patient presents for Total-Body Skin Exam (TBSE) for skin cancer screening and mole check.  The patient has spots, moles and lesions to be evaluated, some may be new or changing and the patient has concerns that these could be cancer.  The following portions of the chart were reviewed this encounter and updated as appropriate:   Tobacco  Allergies  Meds  Problems  Med Hx  Surg Hx  Fam Hx     Review of Systems:  No other skin or systemic complaints except as noted in HPI or Assessment and Plan.  Objective  Well appearing patient in no apparent distress; mood and affect are within normal limits.  A full examination was performed including scalp, head, eyes, ears, nose, lips, neck, chest, axillae, abdomen, back, buttocks, bilateral upper extremities, bilateral lower extremities, hands, feet, fingers, toes, fingernails, and toenails. All findings within normal limits unless otherwise noted below.  Elbows, knees, toenails Toenail dystrophy   B/L foot and toenails Scaling and maceration web spaces and over distal and lateral soles.    Assessment & Plan  Seborrheic dermatitis Face Seborrheic Dermatitis  -  is a chronic persistent rash characterized by pinkness and scaling most commonly of the mid face but also can occur on the scalp (dandruff), ears; mid chest, mid back and groin.  It tends to be exacerbated by stress and cooler weather.  People who have neurologic disease may experience new onset or exacerbation of existing seborrheic dermatitis.  The condition is not curable but treatable and can be controlled.  Continue Ketoconazole 2% cream QHS M, W, F and  HC 2.5% cream QHS on T, Th, Sat  Related Medications hydrocortisone 2.5 % cream Apply to scaly areas on the face QD on Tuesday, Thursday, and Saturday.  Stasis dermatitis of both legs B/L  leg Stasis in the legs causes chronic leg swelling, which may result in itchy or painful rashes, skin discoloration, skin texture changes, and sometimes ulceration.  Recommend daily graduated compression hose/stockings- easiest to put on first thing in morning, remove at bedtime.  Elevate legs as much as possible. Avoid salt/sodium rich foods.  Psoriasis Elbows, knees, toenails Psoriasis is a chronic non-curable, but treatable genetic/hereditary disease that may have other systemic features affecting other organ systems such as joints (Psoriatic Arthritis). It is associated with an increased risk of inflammatory bowel disease, heart disease, non-alcoholic fatty liver disease, and depression.    Continue OTC Amlactin Rapid Relief to aa's daily.   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 Condition is symptomatic / bothersome to patient. Not to goal. Patient declines oral treatment.  - start Ketoconazole 2% cream QOD alternating with Amlactin.  ketoconazole (NIZORAL) 2 % cream - B/L foot and toenails Apply to the feet QOD alternating with Amlactin. Apply to the face QD on Monday, Wednesday, and Friday  Lentigines - Scattered tan macules - Due to sun exposure - Benign-appearing, observe - Recommend daily broad spectrum sunscreen SPF 30+ to sun-exposed areas, reapply every 2 hours as needed. - Call for any changes  Seborrheic Keratoses - Stuck-on, waxy, tan-brown papules and/or plaques  - Benign-appearing - Discussed benign etiology and prognosis. - Observe - Call for any changes  Melanocytic Nevi - Tan-brown and/or pink-flesh-colored symmetric macules  and papules - Benign appearing on exam today - Observation - Call clinic for new or changing moles - Recommend daily use of broad spectrum spf 30+ sunscreen to sun-exposed areas.   Hemangiomas - Red papules - Discussed  benign nature - Observe - Call for any changes  Actinic Damage - Chronic condition, secondary to cumulative UV/sun exposure - diffuse scaly erythematous macules with underlying dyspigmentation - Recommend daily broad spectrum sunscreen SPF 30+ to sun-exposed areas, reapply every 2 hours as needed.  - Staying in the shade or wearing long sleeves, sun glasses (UVA+UVB protection) and wide brim hats (4-inch brim around the entire circumference of the hat) are also recommended for sun protection.  - Call for new or changing lesions.  History of Basal Cell Carcinoma of the Skin - No evidence of recurrence today - Recommend regular full body skin exams - Recommend daily broad spectrum sunscreen SPF 30+ to sun-exposed areas, reapply every 2 hours as needed.  - Call if any new or changing lesions are noted between office visits  Acrochordons (Skin Tags) - Fleshy, skin-colored pedunculated papules - Benign appearing.  - Observe. - If desired, they can be removed with an in office procedure that is not covered by insurance. - Please call the clinic if you notice any new or changing lesions.  Skin cancer screening performed today.  Return in about 1 year (around 06/30/2023) for TBSE.  Luther Redo, CMA, am acting as scribe for Sarina Ser, MD . Documentation: I have reviewed the above documentation for accuracy and completeness, and I agree with the above.  Sarina Ser, MD

## 2022-07-02 ENCOUNTER — Encounter: Payer: Self-pay | Admitting: Dermatology

## 2022-10-25 ENCOUNTER — Other Ambulatory Visit: Payer: Self-pay

## 2022-10-25 ENCOUNTER — Emergency Department: Payer: BC Managed Care – PPO

## 2022-10-25 ENCOUNTER — Emergency Department
Admission: EM | Admit: 2022-10-25 | Discharge: 2022-10-25 | Disposition: A | Discharge status: Home / Self Care | Payer: BC Managed Care – PPO | Attending: Emergency Medicine | Admitting: Emergency Medicine

## 2022-10-25 DIAGNOSIS — K529 Noninfective gastroenteritis and colitis, unspecified: Secondary | ICD-10-CM | POA: Insufficient documentation

## 2022-10-25 DIAGNOSIS — R1013 Epigastric pain: Secondary | ICD-10-CM | POA: Diagnosis present

## 2022-10-25 DIAGNOSIS — R1084 Generalized abdominal pain: Secondary | ICD-10-CM

## 2022-10-25 LAB — COMPREHENSIVE METABOLIC PANEL
ALT: 26 U/L (ref 0–44)
AST: 20 U/L (ref 15–41)
Albumin: 4.3 g/dL (ref 3.5–5.0)
Alkaline Phosphatase: 52 U/L (ref 38–126)
Anion gap: 7 (ref 5–15)
BUN: 13 mg/dL (ref 6–20)
CO2: 26 mmol/L (ref 22–32)
Calcium: 9.3 mg/dL (ref 8.9–10.3)
Chloride: 109 mmol/L (ref 98–111)
Creatinine, Ser: 1 mg/dL (ref 0.61–1.24)
GFR, Estimated: >60 mL/min (ref >60)
Glucose, Bld: 94 mg/dL (ref 70–99)
Potassium: 4.1 mmol/L (ref 3.5–5.1)
Sodium: 142 mmol/L (ref 135–145)
Total Bilirubin: 1.4 mg/dL — ABNORMAL HIGH (ref 0.3–1.2)
Total Protein: 7.2 g/dL (ref 6.5–8.1)

## 2022-10-25 LAB — CBC
HCT: 43.1 % (ref 39.0–52.0)
Hemoglobin: 15 g/dL (ref 13.0–17.0)
MCH: 30.4 pg (ref 26.0–34.0)
MCHC: 34.8 g/dL (ref 30.0–36.0)
MCV: 87.4 fL (ref 80.0–100.0)
Platelets: 200 10*3/uL (ref 150–400)
RBC: 4.93 MIL/uL (ref 4.22–5.81)
RDW: 11.9 % (ref 11.5–15.5)
WBC: 5.9 10*3/uL (ref 4.0–10.5)
nRBC: 0 % (ref 0.0–0.2)

## 2022-10-25 LAB — URINALYSIS, ROUTINE W REFLEX MICROSCOPIC
Bilirubin Urine: NEGATIVE
Glucose, UA: NEGATIVE mg/dL
Hgb urine dipstick: NEGATIVE
Ketones, ur: NEGATIVE mg/dL
Leukocytes,Ua: NEGATIVE
Nitrite: NEGATIVE
Protein, ur: NEGATIVE mg/dL
Specific Gravity, Urine: 1.006 (ref 1.005–1.030)
pH: 5 (ref 5.0–8.0)

## 2022-10-25 LAB — LIPASE, BLOOD: Lipase: 34 U/L (ref 11–51)

## 2022-10-25 MED ORDER — METRONIDAZOLE 500 MG PO TABS: 500.0000 mg | ORAL_TABLET | Freq: Three times a day (TID) | ORAL | 0 refills | Status: AC

## 2022-10-25 MED ORDER — CIPROFLOXACIN HCL 500 MG PO TABS: 500.0000 mg | ORAL_TABLET | Freq: Two times a day (BID) | ORAL | 0 refills | Status: AC

## 2022-10-25 NOTE — ED Provider Triage Note (Signed)
  Emergency Medicine Provider Triage Evaluation Note  Scott Gillespie. , a 51 y.o.male,  was evaluated in triage.  Pt complains of abdominal pain.  Patient states he has been getting intermittent sharp pains in his abdomen, particular in the lower left and lower right side radiating to the back.  Occasionally gets some epigastric pain as well.  He is unsure what may be causing it.   Review of Systems  Positive: Abdominal pain. Negative: Denies fever, chest pain, vomiting  Physical Exam   Vitals:   10/25/22 1442  BP: (!) 140/97  Pulse: 64  Resp: 18  Temp: 98 F (36.7 C)  SpO2: 98%   Gen:   Awake, no distress   Resp:  Normal effort  MSK:   Moves extremities without difficulty  Other:    Medical Decision Making  Given the patient's initial medical screening exam, the following diagnostic evaluation has been ordered. The patient will be placed in the appropriate treatment space, once one is available, to complete the evaluation and treatment. I have discussed the plan of care with the patient and I have advised the patient that an ED physician or mid-level practitioner will reevaluate their condition after the test results have been received, as the results may give them additional insight into the type of treatment they may need.    Diagnostics: Labs, UA, abdominal CT  Treatments: none immediately   Teodoro Spray, [place] 10/25/22 1621

## 2022-10-25 NOTE — ED Provider Notes (Signed)
[facility] Provider Note    Event Date/Time   First MD Initiated Contact with Patient 10/25/22 1740     (approximate)   History   Abdominal Pain   HPI  Scott Gillespie. is a 51 y.o. male   Past medical history of appendectomy and small bowel obstruction who presents to the emergency department with several days of sharp pains intermittent to the epigastrium nonradiating, no diarrhea or blood or melena in the stools, no urinary symptoms, no nausea or vomiting.  He has had no fever or chills.  He is concerned that he might have developed a bowel obstruction since he has had 1 in the past.    Last bowel movement was last night.  The history is obtained by the patient.  His wife is at bedside who offers collateral information as independent historian who states that he has had prior bowel obstruction that presented initially with pain, but also had nausea and vomiting at that time.      Physical Exam   Triage Vital Signs: ED Triage Vitals  Enc Vitals Group     BP 10/25/22 1442 (!) 140/97     Pulse Rate 10/25/22 1442 64     Resp 10/25/22 1442 18     Temp 10/25/22 1442 98 F (36.7 C)     Temp Source 10/25/22 1442 Oral     SpO2 10/25/22 1442 98 %     Weight 10/25/22 1443 206 lb (93.4 kg)     Height 10/25/22 1443 '5\' 10"'$  (1.778 m)     Head Circumference --      Peak Flow --      Pain Score 10/25/22 1442 8     Pain Loc --      Pain Edu? --      Excl. in [place]? --     Most recent vital signs: Vitals:   10/25/22 1442 10/25/22 1757  BP: (!) 140/97 (!) 144/96  Pulse: 64   Resp: 18 18  Temp: 98 F (36.7 C)   SpO2: 98%     General: Awake, no distress.  CV:  Good peripheral perfusion.  Resp:  Normal effort.  Abd:  No distention.  Mild tenderness to the epigastrium.  No rigidity or guarding.    ED Results / Procedures / Treatments   Labs (all labs ordered are listed, but only abnormal results are displayed) Labs Reviewed  COMPREHENSIVE  METABOLIC PANEL - Abnormal; Notable for the following components:      Result Value   Total Bilirubin 1.4 (*)    All other components within normal limits  URINALYSIS, ROUTINE W REFLEX MICROSCOPIC - Abnormal; Notable for the following components:   Color, Urine STRAW (*)    APPearance CLEAR (*)    All other components within normal limits  LIPASE, BLOOD  CBC     I reviewed labs and they are notable for normal H&H, unchanged from prior obtained in the summertime last year, normal WBC   RADIOLOGY I independently reviewed and interpreted CT of the abdomen pelvis and see no obvious obstructive or infectious processes.   PROCEDURES:  Critical Care performed: No  Procedures   MEDICATIONS ORDERED IN ED: Medications - No data to display    IMPRESSION / MDM / [place] / ED COURSE  I reviewed the triage vital signs and the nursing notes.  Differential diagnosis includes, but is not limited to, bowel obstruction, intra-abdominal infection, cholecystitis, urinary tract infection, gastroenteritis, colitis    MDM: This patient with a history of bowel obstructions concerning for bowel obstruction with abdominal pain, not characteristic without nausea or vomiting and intermittent sharp pains.  His CT scan does not show signs of frank obstruction but does have some inflammatory changes around the bowels.  He has no fever or chills and his hemodynamics are appropriate and reassuring here in the emergency department.  I will order antibiotics prescription for colitis.  I gave him strict return precautions for worsening pain, nausea or vomiting, no flatus or bowel movements as he is at high risk for obstruction.  He can be discharged at this time given his stability and no frank obstruction on imaging.   Patient's presentation is most consistent with acute presentation with potential threat to life or bodily function.       FINAL CLINICAL  IMPRESSION(S) / ED DIAGNOSES   Final diagnoses:  Generalized abdominal pain  Enteritis     Rx / DC Orders   ED Discharge Orders          Ordered    ciprofloxacin (CIPRO) 500 MG tablet  2 times daily        10/25/22 1807    metroNIDAZOLE (FLAGYL) 500 MG tablet  3 times daily        10/25/22 1807             Note:  This document was prepared using Dragon voice recognition software and may include unintentional dictation errors.    Lucillie Garfinkel, MD 10/25/22 [phone]

## 2022-10-25 NOTE — ED Triage Notes (Signed)
Complains of sharp pains in abd.  With pain going  into lower back.  No nausea vomiting or diarrhea.  Normal BM.  Pain has been going on since Sunday and has got worse.

## 2022-10-25 NOTE — Discharge Instructions (Signed)
Take antibiotics.  Take acetaminophen 650 mg and ibuprofen 400 mg every 6 hours for pain.  Take with food.  Like you have a bowel obstruction but you are prone to developing bowel obstructions with your history of abdominal surgeries and prior obstruction.  If you develop worsening pain, nausea and vomiting, no stool output or no passing gas for the next 24 to 48 hours then come back to the emergency department to recheck if you have a obstruction.  Thank you for choosing [place] for your health care today!  Please see your primary doctor this week for a follow up appointment.   If you do not have a primary doctor call the following clinics to establish care:  If you have insurance:  [facility] [phone] [place] [address]   [facility]  [phone] [address] [place] [address]   If you do not have insurance:  [facility]  [phone] [address], [place] [address]  Sometimes, in the early stages of certain disease courses it is difficult to detect in the emergency department evaluation -- so, it is important that you continue to monitor your symptoms and call your doctor right away or return to the emergency department if you develop any new or worsening symptoms.  It was my pleasure to care for you today.   Hoover Brunette Jacelyn Grip, MD

## 2023-06-13 ENCOUNTER — Ambulatory Visit (INDEPENDENT_AMBULATORY_CARE_PROVIDER_SITE_OTHER): Payer: BC Managed Care – PPO | Admitting: Dermatology

## 2023-06-13 VITALS — BP 137/83 | HR 67

## 2023-06-13 DIAGNOSIS — Z85828 Personal history of other malignant neoplasm of skin: Secondary | ICD-10-CM

## 2023-06-13 DIAGNOSIS — Z1283 Encounter for screening for malignant neoplasm of skin: Secondary | ICD-10-CM

## 2023-06-13 DIAGNOSIS — W908XXA Exposure to other nonionizing radiation, initial encounter: Secondary | ICD-10-CM

## 2023-06-13 DIAGNOSIS — L821 Other seborrheic keratosis: Secondary | ICD-10-CM

## 2023-06-13 DIAGNOSIS — D2262 Melanocytic nevi of left upper limb, including shoulder: Secondary | ICD-10-CM

## 2023-06-13 DIAGNOSIS — L814 Other melanin hyperpigmentation: Secondary | ICD-10-CM | POA: Diagnosis not present

## 2023-06-13 DIAGNOSIS — L609 Nail disorder, unspecified: Secondary | ICD-10-CM

## 2023-06-13 DIAGNOSIS — L578 Other skin changes due to chronic exposure to nonionizing radiation: Secondary | ICD-10-CM | POA: Diagnosis not present

## 2023-06-13 DIAGNOSIS — D229 Melanocytic nevi, unspecified: Secondary | ICD-10-CM

## 2023-06-13 NOTE — Patient Instructions (Signed)
Due to recent changes in healthcare laws, you may see results of your pathology and/or laboratory studies on MyChart before the doctors have had a chance to review them. We understand that in some cases there may be results that are confusing or concerning to you. Please understand that not all results are received at the same time and often the doctors may need to interpret multiple results in order to provide you with the best plan of care or course of treatment. Therefore, we ask that you please give us 2 business days to thoroughly review all your results before contacting the office for clarification. Should we see a critical lab result, you will be contacted sooner.   If You Need Anything After Your Visit  If you have any questions or concerns for your doctor, please call our main line at [phone] and press option 4 to reach your doctor's medical assistant. If no one answers, please leave a voicemail as directed and we will return your call as soon as possible. Messages left after 4 pm will be answered the following business day.   You may also send us a message via MyChart. We typically respond to MyChart messages within 1-2 business days.  For prescription refills, please ask your pharmacy to contact our office. Our fax number is [phone].  If you have an urgent issue when the clinic is closed that cannot wait until the next business day, you can page your doctor at the number below.    Please note that while we do our best to be available for urgent issues outside of office hours, we are not available 24/7.   If you have an urgent issue and are unable to reach us, you may choose to seek medical care at your doctor's office, retail clinic, urgent care center, or emergency room.  If you have a medical emergency, please immediately call 911 or go to the emergency department.  Pager Numbers  - Dr. Kowalski: [phone]  - Dr. Moye: [phone]  - Dr. Stewart:  [phone]  In the event of inclement weather, please call our main line at [phone] for an update on the status of any delays or closures.  Dermatology Medication Tips: Please keep the boxes that topical medications come in in order to help keep track of the instructions about where and how to use these. Pharmacies typically print the medication instructions only on the boxes and not directly on the medication tubes.   If your medication is too expensive, please contact our office at [phone] option 4 or send us a message through MyChart.   We are unable to tell what your co-pay for medications will be in advance as this is different depending on your insurance coverage. However, we may be able to find a substitute medication at lower cost or fill out paperwork to get insurance to cover a needed medication.   If a prior authorization is required to get your medication covered by your insurance company, please allow us 1-2 business days to complete this process.  Drug prices often vary depending on where the prescription is filled and some pharmacies may offer cheaper prices.  The website www.goodrx.com contains coupons for medications through different pharmacies. The prices here do not account for what the cost may be with help from insurance (it may be cheaper with your insurance), but the website can give you the price if you did not use any insurance.  - You can print the associated coupon and take it with   your prescription to the pharmacy.  - You may also stop by our office during regular business hours and pick up a GoodRx coupon card.  - If you need your prescription sent electronically to a different pharmacy, notify our office through [place] MyChart or by phone at [phone] option 4.     Si Usted Necesita Algo Despus de Su Visita  Tambin puede enviarnos un mensaje a travs de MyChart. Por lo general respondemos a los mensajes de MyChart en el transcurso de 1 a 2  das hbiles.  Para renovar recetas, por favor pida a su farmacia que se ponga en contacto con nuestra oficina. Nuestro nmero de fax es el [phone].  Si tiene un asunto urgente cuando la clnica est cerrada y que no puede esperar hasta el siguiente da hbil, puede llamar/localizar a su doctor(a) al nmero que aparece a continuacin.   Por favor, tenga en cuenta que aunque hacemos todo lo posible para estar disponibles para asuntos urgentes fuera del horario de oficina, no estamos disponibles las 24 horas del da, los 7 das de la semana.   Si tiene un problema urgente y no puede comunicarse con nosotros, puede optar por buscar atencin mdica  en el consultorio de su doctor(a), en una clnica privada, en un centro de atencin urgente o en una sala de emergencias.  Si tiene una emergencia mdica, por favor llame inmediatamente al 911 o vaya a la sala de emergencias.  Nmeros de bper  - Dr. Kowalski: [phone]  - Dra. Moye: [phone]  - Dra. Stewart: [phone]  En caso de inclemencias del tiempo, por favor llame a nuestra lnea principal al [phone] para una actualizacin sobre el estado de cualquier retraso o cierre.  Consejos para la medicacin en dermatologa: Por favor, guarde las cajas en las que vienen los medicamentos de uso tpico para ayudarle a seguir las instrucciones sobre dnde y cmo usarlos. Las farmacias generalmente imprimen las instrucciones del medicamento slo en las cajas y no directamente en los tubos del medicamento.   Si su medicamento es muy caro, por favor, pngase en contacto con nuestra oficina llamando al [phone] y presione la opcin 4 o envenos un mensaje a travs de MyChart.   No podemos decirle cul ser su copago por los medicamentos por adelantado ya que esto es diferente dependiendo de la cobertura de su seguro. Sin embargo, es posible que podamos encontrar un medicamento sustituto a menor costo o llenar un formulario para que el  seguro cubra el medicamento que se considera necesario.   Si se requiere una autorizacin previa para que su compaa de seguros cubra su medicamento, por favor permtanos de 1 a 2 das hbiles para completar este proceso.  Los precios de los medicamentos varan con frecuencia dependiendo del lugar de dnde se surte la receta y alguna farmacias pueden ofrecer precios ms baratos.  El sitio web www.goodrx.com tiene cupones para medicamentos de diferentes farmacias. Los precios aqu no tienen en cuenta lo que podra costar con la ayuda del seguro (puede ser ms barato con su seguro), pero el sitio web puede darle el precio si no utiliz ningn seguro.  - Puede imprimir el cupn correspondiente y llevarlo con su receta a la farmacia.  - Tambin puede pasar por nuestra oficina durante el horario de atencin regular y recoger una tarjeta de cupones de GoodRx.  - Si necesita que su receta se enve electrnicamente a una farmacia diferente, informe a nuestra oficina a travs de MyChart de [place]   o por telfono llamando al [phone] y presione la opcin 4.  

## 2023-06-13 NOTE — Progress Notes (Signed)
   Follow-Up Visit   Subjective  Scott Gillespie. is a 52 y.o. male who presents for the following: Skin Cancer Screening and Full Body Skin Exam, hx of BCC The patient presents for Total-Body Skin Exam (TBSE) for skin cancer screening and mole check. The patient has spots, moles and lesions to be evaluated, some may be new or changing and the patient may have concern these could be cancer.  The following portions of the chart were reviewed this encounter and updated as appropriate: medications, allergies, medical history  Review of Systems:  No other skin or systemic complaints except as noted in HPI or Assessment and Plan.  Objective  Well appearing patient in no apparent distress; mood and affect are within normal limits.  A full examination was performed including scalp, head, eyes, ears, nose, lips, neck, chest, axillae, abdomen, back, buttocks, bilateral upper extremities, bilateral lower extremities, hands, feet, fingers, toes, fingernails, and toenails. All findings within normal limits unless otherwise noted below.   Relevant physical exam findings are noted in the Assessment and Plan.   Assessment & Plan   SKIN CANCER SCREENING PERFORMED TODAY.  ACTINIC DAMAGE - Chronic condition, secondary to cumulative UV/sun exposure - diffuse scaly erythematous macules with underlying dyspigmentation - Recommend daily broad spectrum sunscreen SPF 30+ to sun-exposed areas, reapply every 2 hours as needed.  - Staying in the shade or wearing long sleeves, sun glasses (UVA+UVB protection) and wide brim hats (4-inch brim around the entire circumference of the hat) are also recommended for sun protection.  - Call for new or changing lesions.  LENTIGINES, SEBORRHEIC KERATOSES, HEMANGIOMAS - Benign normal skin lesions - Benign-appearing - Call for any change  NEVUS Left superior scapula  0.4 mm tan macule  - Benign appearing on exam today - Observation - Call clinic for new or  changing moles - Recommend daily use of broad spectrum spf 30+ sunscreen to sun-exposed areas.   Nail dystrophy  -  (tinea pedis of both feet With tinea unguium?)  B/L foot and toenails Chronic and persistent condition with duration or expected duration over one year. Condition is symptomatic / bothersome to patient. Not to goal. Nail clipping sent to [facility]  Continue Ketoconazole 2% cream QOD alternating with Amlactin.   HISTORY OF BASAL CELL CARCINOMA OF THE SKIN - No evidence of recurrence today - Recommend regular full body skin exams - Recommend daily broad spectrum sunscreen SPF 30+ to sun-exposed areas, reapply every 2 hours as needed.  - Call if any new or changing lesions are noted between office visits   Return in about 1 year (around 06/12/2024) for TBSE, hx of BCC.  IAngelique Holm, CMA, am acting as scribe for Armida Sans, MD .   Documentation: I have reviewed the above documentation for accuracy and completeness, and I agree with the above.  Armida Sans, MD

## 2023-06-15 ENCOUNTER — Encounter: Payer: Self-pay | Admitting: Dermatology

## 2023-06-19 ENCOUNTER — Telehealth: Payer: Self-pay

## 2023-06-19 NOTE — Telephone Encounter (Signed)
[facility] results report scanned in under the media tab for your review.

## 2023-06-25 NOTE — Telephone Encounter (Signed)
Left patient msg to return my call. aw

## 2023-06-26 NOTE — Telephone Encounter (Signed)
Patient advised of results and advised of information per Dr. Gwen Pounds. aw

## 2023-06-26 NOTE — Telephone Encounter (Signed)
Left second message for patient to return call. aw

## 2024-03-21 ENCOUNTER — Ambulatory Visit
Admission: EM | Admit: 2024-03-21 | Discharge: 2024-03-21 | Disposition: A | Discharge status: Home / Self Care | Attending: Emergency Medicine | Admitting: Emergency Medicine

## 2024-03-21 ENCOUNTER — Encounter: Payer: Self-pay | Admitting: Emergency Medicine

## 2024-03-21 DIAGNOSIS — J0140 Acute pansinusitis, unspecified: Secondary | ICD-10-CM

## 2024-03-21 DIAGNOSIS — R051 Acute cough: Secondary | ICD-10-CM

## 2024-03-21 MED ORDER — BENZONATATE 100 MG PO CAPS: 200.0000 mg | ORAL_CAPSULE | Freq: Three times a day (TID) | ORAL | 0 refills | Status: DC

## 2024-03-21 MED ORDER — AMOXICILLIN-POT CLAVULANATE 875-125 MG PO TABS: 1.0000 | ORAL_TABLET | Freq: Two times a day (BID) | ORAL | 0 refills | Status: AC

## 2024-03-21 MED ORDER — IPRATROPIUM BROMIDE 0.06 % NA SOLN: 2.0000 | Freq: Four times a day (QID) | NASAL | 12 refills | Status: DC

## 2024-03-21 MED ORDER — PROMETHAZINE-DM 6.25-15 MG/5ML PO SYRP: 5.0000 mL | ORAL_SOLUTION | Freq: Four times a day (QID) | ORAL | 0 refills | Status: AC | PRN

## 2024-03-21 NOTE — ED Triage Notes (Signed)
 Pt c/o nasal congestion, ear fullness, runny nose, cough, sinus pain/pressure. Started about a week ago. He states he had a fever the first couple of days.

## 2024-03-21 NOTE — ED Provider Notes (Signed)
 [facility]    CSN: [id number] Arrival date & time: 03/21/24  0944      History   Chief Complaint Chief Complaint  Patient presents with   Nasal Congestion    HPI Scott Gillespie. is a 53 y.o. male.   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  Past Medical History:  Diagnosis Date   Basal cell carcinoma 04/14/2020   Right lat. lower eyelid. Infiltrative pattern. tx with Mohs Dr.Cook   Hyperlipemia 05/09/2011   Thyroid disease 05/05/2011    Patient Active Problem List   Diagnosis Date Noted   H/O small bowel obstruction 12/14/2016    Past Surgical History:  Procedure Laterality Date   APPENDECTOMY Right 05/1984   LAPAROTOMY N/A 12/03/2016   Procedure: EXPLORATORY LAPAROTOMY;  Surgeon: Claudia Cuff, MD;  Location: [facility] ORS;  Service: General;  Laterality: N/A;       Home Medications    Prior to Admission medications   Medication Sig Start Date End Date Taking? Authorizing Provider  amoxicillin -clavulanate (AUGMENTIN ) 875-125 MG tablet Take 1 tablet by mouth every 12 (twelve) hours for 7 days. 03/21/24 03/28/24 Yes Kent Pear, NP  benzonatate  (TESSALON ) 100 MG capsule Take 2 capsules (200 mg total) by mouth every 8 (eight) hours. 03/21/24  Yes Kent Pear, NP  cetirizine (ZYRTEC) 10 MG tablet Take 10 mg by mouth daily.   Yes [provider]  esomeprazole (NEXIUM) 40 MG capsule Take 40 mg by mouth daily at 12 noon.   Yes [provider]  fluticasone  (FLONASE ) 50 MCG/ACT nasal spray Place 1 spray into both nostrils 2 (two) times daily. 03/25/16  Yes Betancourt, Tina A, NP   ipratropium (ATROVENT ) 0.06 % nasal spray Place 2 sprays into both nostrils 4 (four) times daily. 03/21/24  Yes Kent Pear, NP  levothyroxine (SYNTHROID, LEVOTHROID) 150 MCG tablet Take 150 mcg by mouth daily before breakfast.   Yes [provider]  promethazine -dextromethorphan (PROMETHAZINE -DM) 6.25-15 MG/5ML syrup Take 5 mLs by mouth 4 (four) times daily as needed. 03/21/24  Yes Kent Pear, NP  Rosuvastatin Calcium (CRESTOR PO) Take 10 mg by mouth daily.    Yes [provider]  econazole nitrate 1 % cream Apply topically. Patient not taking: Reported on 06/29/2022 10/12/14   [provider]  hydrocortisone  2.5 % cream Apply to scaly areas on the face QD on Tuesday, Thursday, and Saturday. 06/29/22   Elta Halter, MD  ibuprofen  (ADVIL ,MOTRIN ) 600 MG tablet Take 1 tablet (600 mg total) by mouth every 8 (eight) hours as needed for fever or mild pain. 12/07/16   Emmalene Hare, MD  ketoconazole  (NIZORAL ) 2 % cream Apply to the feet QOD alternating with Amlactin. Apply to the face QD on Monday, Wednesday, and Friday 06/29/22   Elta Halter, MD  sodium chloride  (OCEAN) 0.65 % SOLN nasal spray Place 2 sprays into both nostrils every 2 (two) hours while awake. 03/25/16 12/02/17  Betancourt, Cleotis Daily, NP    Family History Family History  Family history unknown: Yes    Social History Social History   Tobacco Use   Smoking status: Former    Current packs/day: 0.00  Average packs/day: 2.0 packs/day for 18.0 years (36.1 ttl pk-yrs)    Types: Cigarettes    Start date: 09/02/1990    Quit date: 12/02/2006    Years since quitting: 17.3   Smokeless tobacco: Never  Vaping Use   Vaping status: Never Used  Substance Use Topics   Alcohol use: No   Drug use: No     Allergies   Patient has no known allergies.   Review of Systems Review of Systems  Constitutional:  Positive for fever.  HENT:  Positive for congestion, ear pain, rhinorrhea and sinus pain. Negative  for sore throat.   Respiratory:  Positive for cough. Negative for shortness of breath and wheezing.      Physical Exam Triage Vital Signs ED Triage Vitals  Encounter Vitals Group     BP 03/21/24 0958 129/82     Systolic BP Percentile --      Diastolic BP Percentile --      Pulse Rate 03/21/24 0958 60     Resp 03/21/24 0958 16     Temp 03/21/24 0958 98.6 F (37 C)     Temp Source 03/21/24 0958 Oral     SpO2 03/21/24 0958 98 %     Weight 03/21/24 0956 205 lb 14.6 oz (93.4 kg)     Height 03/21/24 0956 5\' 10"  (1.778 m)     Head Circumference --      Peak Flow --      Pain Score 03/21/24 0956 6     Pain Loc --      Pain Education --      Exclude from Growth Chart --    No data found.  Updated Vital Signs BP 129/82 (BP Location: Left Arm)   Pulse 60   Temp 98.6 F (37 C) (Oral)   Resp 16   Ht 5\' 10"  (1.778 m)   Wt 205 lb 14.6 oz (93.4 kg)   SpO2 98%   BMI 29.54 kg/m   Visual Acuity Right Eye Distance:   Left Eye Distance:   Bilateral Distance:    Right Eye Near:   Left Eye Near:    Bilateral Near:     Physical Exam Vitals and nursing note reviewed.  Constitutional:      Appearance: Normal appearance. He is not ill-appearing.  HENT:     Head: Normocephalic and atraumatic.     Right Ear: Tympanic membrane, ear canal and external ear normal. There is no impacted cerumen.     Left Ear: Tympanic membrane, ear canal and external ear normal. There is no impacted cerumen.     Nose: Congestion and rhinorrhea present.     Comments: Nasal mucosa is erythematous and markedly edematous.  Patient has tenderness to compression of bilateral frontal and maxillary sinuses.    Mouth/Throat:     Mouth: Mucous membranes are moist.     Pharynx: Oropharynx is clear. No oropharyngeal exudate or posterior oropharyngeal erythema.  Cardiovascular:     Rate and Rhythm: Normal rate and regular rhythm.     Pulses: Normal pulses.     Heart sounds: Normal heart sounds. No murmur heard.     No friction rub. No gallop.  Pulmonary:     Effort: Pulmonary effort is normal.     Breath sounds: Normal breath sounds. No wheezing, rhonchi or rales.  Musculoskeletal:     Cervical back: Normal range of motion and neck supple. No tenderness.  Lymphadenopathy:     Cervical: No cervical adenopathy.  Skin:    General: Skin is warm and dry.     Capillary Refill: Capillary refill takes less than 2 seconds.     Findings: No rash.  Neurological:     General: No focal deficit present.     Mental Status: He is alert and oriented to person, place, and time.      UC Treatments / Results  Labs (all labs ordered are listed, but only abnormal results are displayed) Labs Reviewed - No data to display  EKG   Radiology No results found.  Procedures Procedures (including critical care time)  Medications Ordered in UC Medications - No data to display  Initial Impression / Assessment and Plan / UC Course  I have reviewed the triage vital signs and the nursing notes.  Pertinent labs & imaging results that were available during my care of the patient were reviewed by me and considered in my medical decision making (see chart for details).   Patient is a nontoxic-appearing 53 year old male presenting for evaluation of sinus symptoms as outlined in HPI above.  He does have inflammation of his nasal mucosa with tenderness to compression of bilateral frontal and maxillary sinuses.  I do not appreciate any discharge patient does report fevers at the onset of symptoms as well as bloody discharge from his right nare.  His cardiopulmonary exam is benign.  I will discharge patient home with diagnosis of pansinusitis on Augmentin  875 mg twice daily with food for 7 days.  Atrovent  nasal spray.  Nasal congestion.  Tessalon  Perles symptomatic and positive for cough and congestion.  We discussed using sinus irrigation to help alleviate his mucus burden.   Final Clinical Impressions(s) / UC Diagnoses    Final diagnoses:  Acute non-recurrent pansinusitis  Acute cough     Discharge Instructions      The Augmentin  twice daily with food for 7 days for treatment of your sinusitis.  Perform sinus irrigation 2-3 times a day with a [employer]Med sinus rinse kit and distilled water.  Do not use tap water.  Increase your oral fluid intake to thin out your mucus so that is also able for your body to clear more easily.  Take an over-the-counter probiotic, such as Culturelle-align-activia, 1 hour after each dose of antibiotic to prevent diarrhea.  Use the Atrovent  nasal spray, 2 squirts in each nostril every 6 hours, as needed for runny nose and postnasal drip.  Use the Tessalon  Perles every 8 hours during the day.  Take them with a small sip of water.  They may give you some numbness to the base of your tongue or a metallic taste in your mouth, this is normal.  Use the Promethazine  DM cough syrup at bedtime for cough and congestion.  It will make you drowsy so do not take it during the day.  Return for reevaluation or see your primary care provider for any new or worsening symptoms.      ED Prescriptions     Medication Sig Dispense Auth. Provider   amoxicillin -clavulanate (AUGMENTIN ) 875-125 MG tablet Take 1 tablet by mouth every 12 (twelve) hours for 7 days. 14 tablet Kent Pear, NP   benzonatate  (TESSALON ) 100 MG capsule Take 2 capsules (200 mg total) by mouth every 8 (eight) hours. 21 capsule Kent Pear, NP   ipratropium (ATROVENT ) 0.06 % nasal spray Place 2 sprays into both nostrils 4 (four) times daily. 15 mL Kent Pear, NP   promethazine -dextromethorphan (PROMETHAZINE -DM) 6.25-15 MG/5ML syrup Take 5 mLs by mouth 4 (  four) times daily as needed. 118 mL Kent Pear, NP      PDMP not reviewed this encounter.   Kent Pear, NP 03/21/24 1024

## 2024-03-21 NOTE — Discharge Instructions (Addendum)
 The Augmentin  twice daily with food for 7 days for treatment of your sinusitis.  Perform sinus irrigation 2-3 times a day with a NeilMed sinus rinse kit and distilled water.  Do not use tap water.  Increase your oral fluid intake to thin out your mucus so that is also able for your body to clear more easily.  Take an over-the-counter probiotic, such as Culturelle-align-activia, 1 hour after each dose of antibiotic to prevent diarrhea.  Use the Atrovent  nasal spray, 2 squirts in each nostril every 6 hours, as needed for runny nose and postnasal drip.  Use the Tessalon  Perles every 8 hours during the day.  Take them with a small sip of water.  They may give you some numbness to the base of your tongue or a metallic taste in your mouth, this is normal.  Use the Promethazine  DM cough syrup at bedtime for cough and congestion.  It will make you drowsy so do not take it during the day.  Return for reevaluation or see your primary care provider for any new or worsening symptoms.

## 2024-07-24 ENCOUNTER — Ambulatory Visit: Payer: BC Managed Care – PPO | Admitting: Dermatology

## 2024-07-28 ENCOUNTER — Ambulatory Visit: Admitting: Dermatology

## 2024-09-08 ENCOUNTER — Ambulatory Visit: Admitting: Dermatology

## 2024-09-08 ENCOUNTER — Encounter: Payer: Self-pay | Admitting: Dermatology

## 2024-09-08 DIAGNOSIS — Z7189 Other specified counseling: Secondary | ICD-10-CM

## 2024-09-08 DIAGNOSIS — L719 Rosacea, unspecified: Secondary | ICD-10-CM | POA: Diagnosis not present

## 2024-09-08 DIAGNOSIS — L918 Other hypertrophic disorders of the skin: Secondary | ICD-10-CM

## 2024-09-08 DIAGNOSIS — W908XXA Exposure to other nonionizing radiation, initial encounter: Secondary | ICD-10-CM | POA: Diagnosis not present

## 2024-09-08 DIAGNOSIS — L603 Nail dystrophy: Secondary | ICD-10-CM

## 2024-09-08 DIAGNOSIS — L578 Other skin changes due to chronic exposure to nonionizing radiation: Secondary | ICD-10-CM

## 2024-09-08 DIAGNOSIS — Z79899 Other long term (current) drug therapy: Secondary | ICD-10-CM

## 2024-09-08 DIAGNOSIS — D1801 Hemangioma of skin and subcutaneous tissue: Secondary | ICD-10-CM

## 2024-09-08 DIAGNOSIS — L570 Actinic keratosis: Secondary | ICD-10-CM

## 2024-09-08 DIAGNOSIS — L814 Other melanin hyperpigmentation: Secondary | ICD-10-CM

## 2024-09-08 DIAGNOSIS — Z1283 Encounter for screening for malignant neoplasm of skin: Secondary | ICD-10-CM

## 2024-09-08 DIAGNOSIS — I781 Nevus, non-neoplastic: Secondary | ICD-10-CM

## 2024-09-08 DIAGNOSIS — R202 Paresthesia of skin: Secondary | ICD-10-CM

## 2024-09-08 DIAGNOSIS — L821 Other seborrheic keratosis: Secondary | ICD-10-CM

## 2024-09-08 DIAGNOSIS — Z85828 Personal history of other malignant neoplasm of skin: Secondary | ICD-10-CM

## 2024-09-08 NOTE — Patient Instructions (Addendum)
 NOTALGIA PARESTHETICA Exam: Perispinal hyperpigmented patch Chronic condition without cure secondary to pinched nerve along spine causing itching or sensation changes in an area of skin. Chronic rubbing or scratching causes darkening of the skin.  OTC treatments which can help with itch include numbing creams like pramoxine or lidocaine  which temporarily reduce itch or Capsaicin-containing creams which cause a burning sensation but which sometimes over time will reset the nerves to stop producing itch.  If you choose to use Capsaicin cream, it is recommended to use it 5 times daily for 1 week followed by 3 times daily for 3-6 weeks. You may have to continue using it long-term.  If not doing well with OTC options, could consider [employer] compounded prescription anti-itch cream with Amitriptyline 5% / Lidocaine  5% / Pramoxine 1% or Amitriptyline 5% / Gabapentin 10% / Lidocaine  5% Cream or other prescription cream or pill options.    Due to recent changes in healthcare laws, you may see results of your pathology and/or laboratory studies on MyChart before the doctors have had a chance to review them. We understand that in some cases there may be results that are confusing or concerning to you. Please understand that not all results are received at the same time and often the doctors may need to interpret multiple results in order to provide you with the best plan of care or course of treatment. Therefore, we ask that you please give us  2 business days to thoroughly review all your results before contacting the office for clarification. Should we see a critical lab result, you will be contacted sooner.   If You Need Anything After Your Visit  If you have any questions or concerns for your doctor, please call our main line at [phone] and press option 4 to reach your doctor's medical assistant. If no one answers, please leave a voicemail as directed and we will return your call as soon as  possible. Messages left after 4 pm will be answered the following business day.   You may also send us  a message via MyChart. We typically respond to MyChart messages within 1-2 business days.  For prescription refills, please ask your pharmacy to contact our office. Our fax number is [phone].  If you have an urgent issue when the clinic is closed that cannot wait until the next business day, you can page your doctor at the number below.    Please note that while we do our best to be available for urgent issues outside of office hours, we are not available 24/7.   If you have an urgent issue and are unable to reach us , you may choose to seek medical care at your doctor's office, retail clinic, urgent care center, or emergency room.  If you have a medical emergency, please immediately call 911 or go to the emergency department.  Pager Numbers  - Dr. Hester: [phone]  - Dr. Jackquline: [phone]  - Dr. Claudene: [phone]   - Dr. Raymund: [phone]  In the event of inclement weather, please call our main line at [phone] for an update on the status of any delays or closures.  Dermatology Medication Tips: Please keep the boxes that topical medications come in in order to help keep track of the instructions about where and how to use these. Pharmacies typically print the medication instructions only on the boxes and not directly on the medication tubes.   If your medication is too expensive, please contact our office at [phone] option 4 or send  us  a message through MyChart.   We are unable to tell what your co-pay for medications will be in advance as this is different depending on your insurance coverage. However, we may be able to find a substitute medication at lower cost or fill out paperwork to get insurance to cover a needed medication.   If a prior authorization is required to get your medication covered by your insurance company, please allow us  1-2 business  days to complete this process.  Drug prices often vary depending on where the prescription is filled and some pharmacies may offer cheaper prices.  The website www.goodrx.com contains coupons for medications through different pharmacies. The prices here do not account for what the cost may be with help from insurance (it may be cheaper with your insurance), but the website can give you the price if you did not use any insurance.  - You can print the associated coupon and take it with your prescription to the pharmacy.  - You may also stop by our office during regular business hours and pick up a GoodRx coupon card.  - If you need your prescription sent electronically to a different pharmacy, notify our office through [facility] or by phone at [phone] option 4.     Si Usted Necesita Algo Despus de Su Visita  Tambin puede enviarnos un mensaje a travs de Clinical cytogeneticist. Por lo general respondemos a los mensajes de MyChart en el transcurso de 1 a 2 das hbiles.  Para renovar recetas, por favor pida a su farmacia que se ponga en contacto con nuestra oficina. Randi lakes de fax es [place] [phone].  Si tiene un asunto urgente cuando la clnica est cerrada y que no puede esperar hasta el siguiente da hbil, puede llamar/localizar a su doctor(a) al nmero que aparece a continuacin.   Por favor, tenga en cuenta que aunque hacemos todo lo posible para estar disponibles para asuntos urgentes fuera del horario de [place], no estamos disponibles las 24 horas del da, los 7 [address]  [address] de la [place].   Si tiene un problema urgente y no puede comunicarse con nosotros, puede optar por buscar atencin mdica  en el consultorio de su doctor(a), en una clnica privada, en un centro de atencin urgente o en una sala de emergencias.  Si tiene Engineer, drilling, por favor llame inmediatamente al 911 o vaya a la sala de emergencias.  Nmeros de bper  - Dr. Hester: [phone]  - Dra. Jackquline:  [phone]  - Dr. Claudene: [phone]  - Dra. Kitts: [phone]  En caso de inclemencias del [place], por favor llame a nuestra lnea principal al [phone] para una actualizacin sobre el estado de cualquier retraso o cierre.  Consejos para la medicacin en dermatologa: Por favor, guarde las cajas en las que vienen los medicamentos de uso tpico para ayudarle a seguir las instrucciones sobre dnde y cmo usarlos. Las farmacias generalmente imprimen las instrucciones del medicamento slo en las cajas y no directamente en los tubos del [place].   Si su medicamento es muy caro, por favor, pngase en contacto con landry rieger llamando al [phone] y presione la opcin 4 o envenos un mensaje a travs de Clinical cytogeneticist.   No podemos decirle cul ser su copago por los medicamentos por adelantado ya que esto es diferente dependiendo de la cobertura de su seguro. Sin embargo, es posible que podamos encontrar un medicamento sustituto a Audiological scientist un formulario para que el seguro cubra el medicamento que se Technical brewer  necesario.   Si se requiere una autorizacin previa para que su compaa de seguros malta su medicamento, por favor permtanos de 1 a 2 das hbiles para completar este proceso.  Los precios de los medicamentos varan con frecuencia dependiendo del Environmental consultant de dnde se surte la receta y alguna farmacias pueden ofrecer precios ms baratos.  El sitio web www.goodrx.com tiene cupones para medicamentos de Health and safety inspector. Los precios aqu no tienen en cuenta lo que podra costar con la ayuda del seguro (puede ser ms barato con su seguro), pero el sitio web puede darle el precio si no utiliz Tourist information centre manager.  - Puede imprimir el cupn correspondiente y llevarlo con su receta a la farmacia.  - Tambin puede pasar por nuestra oficina durante el horario de atencin regular y Education officer, museum una tarjeta de cupones de GoodRx.  - Si necesita que su receta se enve electrnicamente a una  farmacia diferente, informe a nuestra oficina a travs de MyChart de [place] o por telfono llamando al [phone] y presione la opcin 4.

## 2024-09-08 NOTE — Progress Notes (Signed)
 Follow-Up Visit   Subjective  Scott Gillespie. is a 53 y.o. male who presents for the following: Skin Cancer Screening and Full Body Skin Exam  The patient presents for Total-Body Skin Exam (TBSE) for skin cancer screening and mole check. The patient has spots, moles and lesions to be evaluated, some may be new or changing and the patient may have concern these could be cancer.  The following portions of the chart were reviewed this encounter and updated as appropriate: medications, allergies, medical history  Review of Systems:  No other skin or systemic complaints except as noted in HPI or Assessment and Plan.  Objective  Well appearing patient in no apparent distress; mood and affect are within normal limits.  A full examination was performed including scalp, head, eyes, ears, nose, lips, neck, chest, axillae, abdomen, back, buttocks, bilateral upper extremities, bilateral lower extremities, hands, feet, fingers, toes, fingernails, and toenails. All findings within normal limits unless otherwise noted below.   Relevant physical exam findings are noted in the Assessment and Plan.  L ear x 1 Erythematous thin papules/macules with gritty scale.   Assessment & Plan   SKIN CANCER SCREENING PERFORMED TODAY.  ACTINIC DAMAGE - Chronic condition, secondary to cumulative UV/sun exposure - diffuse scaly erythematous macules with underlying dyspigmentation - Recommend daily broad spectrum sunscreen SPF 30+ to sun-exposed areas, reapply every 2 hours as needed.  - Staying in the shade or wearing long sleeves, sun glasses (UVA+UVB protection) and wide brim hats (4-inch brim around the entire circumference of the hat) are also recommended for sun protection.  - Call for new or changing lesions.  LENTIGINES, SEBORRHEIC KERATOSES, HEMANGIOMAS - Benign normal skin lesions - Benign-appearing - Call for any changes  MELANOCYTIC NEVI - Tan-brown and/or pink-flesh-colored symmetric macules  and papules - Benign appearing on exam today - Observation - Call clinic for new or changing moles - Recommend daily use of broad spectrum spf 30+ sunscreen to sun-exposed areas.   HISTORY OF BASAL CELL CARCINOMA OF THE SKIN - 04/14/2020 R lat lower eyelid, infiltrative pattern, tx with Mohs, Dr.Cook - No evidence of recurrence today - Recommend regular full body skin exams - Recommend daily broad spectrum sunscreen SPF 30+ to sun-exposed areas, reapply every 2 hours as needed.  - Call if any new or changing lesions are noted between office visits  ROSACEA Exam Mid face erythema with telangiectasias Rosacea is a chronic progressive skin condition usually affecting the face of adults, causing redness and/or acne bumps. It is treatable but not curable. It sometimes affects the eyes (ocular rosacea) as well. It may respond to topical and/or systemic medication and can flare with stress, sun exposure, alcohol, exercise, topical steroids (including hydrocortisone /cortisone 10) and some foods.  Daily application of broad spectrum spf 30+ sunscreen to face is recommended to reduce flares. Treatment Plan Counseling for BBL / IPL / Laser and Coordination of Care Discussed the treatment option of Broad Band Light (BBL) /Intense Pulsed Light (IPL)/ Laser for skin discoloration, including brown spots and redness.  Typically we recommend at least 1-3 treatment sessions about 5-8 weeks apart for best results.  Cannot have tanned skin when BBL performed, and regular use of sunscreen/photoprotection is advised after the procedure to help maintain results. The patient's condition may also require maintenance treatments in the future.  The fee for BBL / laser treatments is $350 per treatment session for the whole face.  A fee can be quoted for other parts of the body.  Insurance typically does not pay for BBL/laser treatments and therefore the fee is an out-of-pocket cost. Recommend prophylactic valtrex treatment.  Once scheduled for procedure, will send Rx in prior to patient's appointment.    NAIL PROBLEM likely due to trauma or psoriasis, pt denies both - Qlab molecular study negative for pathogens 06/13/2023, exam consistent with trauma Exam: dystrophic nail Treatment Plan: No treatment needed.    AK (ACTINIC KERATOSIS) L ear x 1 Actinic keratoses are precancerous spots that appear secondary to cumulative UV radiation exposure/sun exposure over time. They are chronic with expected duration over 1 year. A portion of actinic keratoses will progress to squamous cell carcinoma of the skin. It is not possible to reliably predict which spots will progress to skin cancer and so treatment is recommended to prevent development of skin cancer.  Recommend daily broad spectrum sunscreen SPF 30+ to sun-exposed areas, reapply every 2 hours as needed.  Recommend staying in the shade or wearing long sleeves, sun glasses (UVA+UVB protection) and wide brim hats (4-inch brim around the entire circumference of the hat). Call for new or changing lesions.  Destruction of lesion - L ear x 1 Complexity: simple   Destruction method: cryotherapy   Informed consent: discussed and consent obtained   Timeout:  patient name, date of birth, surgical site, and procedure verified Lesion destroyed using liquid nitrogen: Yes   Region frozen until ice ball extended beyond lesion: Yes   Outcome: patient tolerated procedure well with no complications   Post-procedure details: wound care instructions given     Acrochordons (Skin Tags) - Fleshy, skin-colored pedunculated papules of axillary area - Benign appearing.  - Observe. - If desired, they can be removed with an in office procedure that is not covered by insurance. - Please call the clinic if you notice any new or changing lesions.  NOTALGIA PARESTHETICA Exam: Perispinal hyperpigmented patch Chronic condition without cure secondary to pinched nerve along spine causing  itching or sensation changes in an area of skin. Chronic rubbing or scratching causes darkening of the skin.  OTC treatments which can help with itch include numbing creams like pramoxine or lidocaine  which temporarily reduce itch or Capsaicin-containing creams which cause a burning sensation but which sometimes over time will reset the nerves to stop producing itch.  If you choose to use Capsaicin cream, it is recommended to use it 5 times daily for 1 week followed by 3 times daily for 3-6 weeks. You may have to continue using it long-term.  If not doing well with OTC options, could consider [facility] compounded prescription anti-itch cream with Amitriptyline 5% / Lidocaine  5% / Pramoxine 1% or Amitriptyline 5% / Gabapentin 10% / Lidocaine  5% Cream or other prescription cream or pill options.   Return in about 1 year (around 09/08/2025) for TBSE - AK, BCC.  LILLETTE Rosina Mayans, CMA, am acting as scribe for Alm Rhyme, MD .   Documentation: I have reviewed the above documentation for accuracy and completeness, and I agree with the above.  Alm Rhyme, MD

## 2024-09-25 ENCOUNTER — Ambulatory Visit (INDEPENDENT_AMBULATORY_CARE_PROVIDER_SITE_OTHER): Payer: Self-pay | Admitting: Cardiology

## 2024-09-25 ENCOUNTER — Encounter: Payer: Self-pay | Admitting: Cardiology

## 2024-09-25 VITALS — BP 120/90 | HR 55 | Ht 67.0 in | Wt 202.2 lb

## 2024-09-25 DIAGNOSIS — Z0130 Encounter for examination of blood pressure without abnormal findings: Secondary | ICD-10-CM

## 2024-09-25 DIAGNOSIS — Z131 Encounter for screening for diabetes mellitus: Secondary | ICD-10-CM

## 2024-09-25 DIAGNOSIS — E782 Mixed hyperlipidemia: Secondary | ICD-10-CM | POA: Diagnosis not present

## 2024-09-25 DIAGNOSIS — E039 Hypothyroidism, unspecified: Secondary | ICD-10-CM | POA: Insufficient documentation

## 2024-09-25 DIAGNOSIS — Z125 Encounter for screening for malignant neoplasm of prostate: Secondary | ICD-10-CM

## 2024-09-25 DIAGNOSIS — Z7689 Persons encountering health services in other specified circumstances: Secondary | ICD-10-CM | POA: Diagnosis not present

## 2024-09-25 MED ORDER — ROSUVASTATIN CALCIUM 10 MG PO TABS: 10.0000 mg | ORAL_TABLET | Freq: Every day | ORAL | 1 refills | Status: AC

## 2024-09-25 MED ORDER — LEVOTHYROXINE SODIUM 137 MCG PO TABS: 137.0000 ug | ORAL_TABLET | Freq: Every morning | ORAL | 1 refills | Status: AC

## 2024-09-25 NOTE — Progress Notes (Signed)
 New Patient Office Visit  Subjective   Patient ID: Scott Gillespie., male    DOB: June 18, 1971  Age: 53 y.o. MRN: [id number]  CC:  Chief Complaint  Patient presents with   New Patient (Initial Visit)    [facility] patient, med refill    HPI Scott Gillespie. presents to establish care Previous Primary Care provider/office:   he does not have additional concerns to discuss today.   Patient in office to establish care. Patient doing well. Reports right hand/finger stiffness in the morning, relaxes as the day goes on. Started recently.  Patient fasting today. Will get lab work. Medications refilled.     Outpatient Encounter Medications as of 09/25/2024  Medication Sig   hydrocortisone  2.5 % cream Apply to scaly areas on the face QD on Tuesday, Thursday, and Saturday.   ibuprofen  (ADVIL ,MOTRIN ) 600 MG tablet Take 1 tablet (600 mg total) by mouth every 8 (eight) hours as needed for fever or mild pain.   ketoconazole  (NIZORAL ) 2 % cream Apply to the feet QOD alternating with Amlactin. Apply to the face QD on Monday, Wednesday, and Friday   promethazine -dextromethorphan (PROMETHAZINE -DM) 6.25-15 MG/5ML syrup Take 5 mLs by mouth 4 (four) times daily as needed.   [DISCONTINUED] cetirizine (ZYRTEC) 10 MG tablet Take 10 mg by mouth daily. (Patient taking differently: Take 10 mg by mouth as needed.)   [DISCONTINUED] fluticasone  (FLONASE ) 50 MCG/ACT nasal spray Place 1 spray into both nostrils 2 (two) times daily. (Patient taking differently: Place 1 spray into both nostrils as needed.)   [DISCONTINUED] levothyroxine (SYNTHROID) 137 MCG tablet Take 137 mcg by mouth every morning.   [DISCONTINUED] Rosuvastatin Calcium (CRESTOR PO) Take 10 mg by mouth daily.    levothyroxine (SYNTHROID) 137 MCG tablet Take 1 tablet (137 mcg total) by mouth every morning.   rosuvastatin (CRESTOR) 10 MG tablet Take 1 tablet (10 mg total) by mouth daily.   [DISCONTINUED] benzonatate  (TESSALON ) 100 MG capsule  Take 2 capsules (200 mg total) by mouth every 8 (eight) hours. (Patient not taking: Reported on 09/25/2024)   [DISCONTINUED] econazole nitrate 1 % cream Apply topically. (Patient not taking: Reported on 09/25/2024)   [DISCONTINUED] esomeprazole (NEXIUM) 40 MG capsule Take 40 mg by mouth daily at 12 noon. (Patient not taking: Reported on 09/25/2024)   [DISCONTINUED] ipratropium (ATROVENT ) 0.06 % nasal spray Place 2 sprays into both nostrils 4 (four) times daily. (Patient not taking: Reported on 09/25/2024)   [DISCONTINUED] levothyroxine (SYNTHROID, LEVOTHROID) 150 MCG tablet Take 150 mcg by mouth daily before breakfast. (Patient taking differently: Take 150 mcg by mouth daily before breakfast. 137 mcg QD)   [DISCONTINUED] sodium chloride  (OCEAN) 0.65 % SOLN nasal spray Place 2 sprays into both nostrils every 2 (two) hours while awake. (Patient not taking: Reported on 09/25/2024)   No facility-administered encounter medications on file as of 09/25/2024.    Past Medical History:  Diagnosis Date   Actinic keratosis    Basal cell carcinoma 04/14/2020   Right lat. lower eyelid. Infiltrative pattern. tx with Mohs Dr.Cook   Hyperlipemia 05/09/2011   Thyroid disease 05/05/2011    Past Surgical History:  Procedure Laterality Date   APPENDECTOMY Right 05/1984   LAPAROTOMY N/A 12/03/2016   Procedure: EXPLORATORY LAPAROTOMY;  Surgeon: Charlie FORBES Fell, MD;  Location: [facility] ORS;  Service: General;  Laterality: N/A;    Family History  Family history unknown: Yes    Social History   Socioeconomic History   Marital status: Married  Spouse name: Scott Gillespie   Number of children: 2   Years of education: 12   Highest education level: Not on file  Occupational History   Occupation: self-employed  Tobacco Use   Smoking status: Former    Current packs/day: 0.00    Average packs/day: 2.0 packs/day for 18.0 years (36.1 ttl pk-yrs)    Types: Cigarettes    Start date: 09/02/1990    Quit date:  12/02/2006    Years since quitting: 17.8   Smokeless tobacco: Never  Vaping Use   Vaping status: Never Used  Substance and Sexual Activity   Alcohol use: No   Drug use: No   Sexual activity: Yes    Partners: Female    Birth control/protection: None, Other-see comments    Comment: married  Other Topics Concern   Not on file  Social History Narrative   Patient likes to be called [name]; patient has 2 daughters and lives at home with family   Social Drivers of Corporate investment banker Strain: Not on file  Food Insecurity: Not on file  Transportation Needs: Not on file  Physical Activity: Not on file  Stress: Not on file  Social Connections: Not on file  Intimate Partner Violence: Not on file    Review of Systems  Constitutional: Negative.   HENT: Negative.    Eyes: Negative.   Respiratory: Negative.  Negative for shortness of breath.   Cardiovascular: Negative.  Negative for chest pain.  Gastrointestinal: Negative.  Negative for abdominal pain, constipation and diarrhea.  Genitourinary: Negative.   Musculoskeletal:  Negative for joint pain and myalgias.  Skin: Negative.   Neurological: Negative.  Negative for dizziness and headaches.  Endo/Heme/Allergies: Negative.   All other systems reviewed and are negative.       Objective   BP (!) 120/90   Pulse (!) 55   Ht 5' 7 (1.702 m)   Wt 202 lb 3.2 oz (91.7 kg)   SpO2 97%   BMI 31.67 kg/m   Physical Exam Nursing note reviewed.  Constitutional:      Appearance: Normal appearance. He is normal weight.  HENT:     Head: Normocephalic and atraumatic.     Nose: Nose normal.     Mouth/Throat:     Mouth: Mucous membranes are moist.     Pharynx: Oropharynx is clear.  Eyes:     Extraocular Movements: Extraocular movements intact.     Conjunctiva/sclera: Conjunctivae normal.     Pupils: Pupils are equal, round, and reactive to light.  Cardiovascular:     Rate and Rhythm: Normal rate and regular rhythm.      Pulses: Normal pulses.     Heart sounds: Normal heart sounds.  Pulmonary:     Effort: Pulmonary effort is normal.     Breath sounds: Normal breath sounds.  Abdominal:     General: Abdomen is flat. Bowel sounds are normal.     Palpations: Abdomen is soft.  Musculoskeletal:        General: Normal range of motion.     Cervical back: Normal range of motion.  Skin:    General: Skin is warm and dry.  Neurological:     General: No focal deficit present.     Mental Status: He is alert and oriented to person, place, and time.  Psychiatric:        Mood and Affect: Mood normal.        Behavior: Behavior normal.  Thought Content: Thought content normal.        Judgment: Judgment normal.        Assessment & Plan:  Fasting lab work today Continue current medications  Problem List Items Addressed This Visit       Endocrine   Hypothyroidism   Relevant Medications   levothyroxine (SYNTHROID) 137 MCG tablet   Other Relevant Orders   TSH     Other   Encounter to establish care   Mixed hyperlipidemia - Primary   Relevant Medications   rosuvastatin (CRESTOR) 10 MG tablet   Other Relevant Orders   Lipid Profile   Other Visit Diagnoses       Diabetes mellitus screening       Relevant Orders   CMP14+EGFR   Hemoglobin A1c     Prostate cancer screening       Relevant Orders   PSA       Return in about 4 months (around 01/26/2025) for fasting lab work prior.   Total time spent: 25 minutes. This time includes review of previous notes and results and patient face to face interaction during today's visit.    Jeoffrey Pollen, NP  09/25/2024   This document may have been prepared by Dragon Voice Recognition software and as such may include unintentional dictation errors.

## 2024-09-26 ENCOUNTER — Ambulatory Visit: Payer: Self-pay | Admitting: Cardiology

## 2024-09-26 LAB — CMP14+EGFR
ALT: 19 IU/L (ref 0–44)
AST: 20 IU/L (ref 0–40)
Albumin: 4.5 g/dL (ref 3.8–4.9)
Alkaline Phosphatase: 63 IU/L (ref 47–123)
BUN/Creatinine Ratio: 11 (ref 9–20)
BUN: 11 mg/dL (ref 6–24)
Bilirubin Total: 0.9 mg/dL (ref 0.0–1.2)
CO2: 25 mmol/L (ref 20–29)
Calcium: 9.8 mg/dL (ref 8.7–10.2)
Chloride: 103 mmol/L (ref 96–106)
Creatinine, Ser: 1 mg/dL (ref 0.76–1.27)
Globulin, Total: 2.1 g/dL (ref 1.5–4.5)
Glucose: 94 mg/dL (ref 70–99)
Potassium: 4.6 mmol/L (ref 3.5–5.2)
Sodium: 141 mmol/L (ref 134–144)
Total Protein: 6.6 g/dL (ref 6.0–8.5)
eGFR: 89 mL/min/1.73 (ref >59)

## 2024-09-26 LAB — HEMOGLOBIN A1C
Est. average glucose Bld gHb Est-mCnc: 105 mg/dL
Hgb A1c MFr Bld: 5.3 % (ref 4.8–5.6)

## 2024-09-26 LAB — LIPID PANEL
Chol/HDL Ratio: 3.7 ratio (ref 0.0–5.0)
Cholesterol, Total: 179 mg/dL (ref 100–199)
HDL: 48 mg/dL (ref >39)
LDL Chol Calc (NIH): 120 mg/dL — ABNORMAL HIGH (ref 0–99)
Triglycerides: 56 mg/dL (ref 0–149)
VLDL Cholesterol Cal: 11 mg/dL (ref 5–40)

## 2024-09-26 LAB — TSH: TSH: 1.32 u[IU]/mL (ref 0.450–4.500)

## 2024-09-26 LAB — PSA: Prostate Specific Ag, Serum: 0.7 ng/mL (ref 0.0–4.0)

## 2025-01-29 ENCOUNTER — Ambulatory Visit: Admitting: Cardiology

## 2025-09-08 ENCOUNTER — Ambulatory Visit: Admitting: Dermatology
